# Patient Record
Sex: Female | Born: 1980
Health system: Southern US, Community
[De-identification: ages and names within clinical notes are randomized; demographics above are authoritative.]

## PROBLEM LIST (undated history)

## (undated) DIAGNOSIS — F329 Major depressive disorder, single episode, unspecified: Secondary | ICD-10-CM

## (undated) DIAGNOSIS — F32A Depression, unspecified: Secondary | ICD-10-CM

## (undated) DIAGNOSIS — N390 Urinary tract infection, site not specified: Secondary | ICD-10-CM

## (undated) HISTORY — DX: Depression, unspecified: F32.A

## (undated) HISTORY — DX: Urinary tract infection, site not specified: N39.0

## (undated) HISTORY — PX: ABLATION: SHX5711

---
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
## 2016-08-11 LAB — HM PAP SMEAR: HM Pap smear: NORMAL

## 2017-04-13 DIAGNOSIS — Z130 Encounter for screening for diseases of the blood and blood-forming organs and certain disorders involving the immune mechanism: Secondary | ICD-10-CM | POA: Diagnosis not present

## 2017-04-13 DIAGNOSIS — Z87891 Personal history of nicotine dependence: Secondary | ICD-10-CM | POA: Diagnosis not present

## 2017-04-13 DIAGNOSIS — K219 Gastro-esophageal reflux disease without esophagitis: Secondary | ICD-10-CM | POA: Diagnosis not present

## 2017-04-13 DIAGNOSIS — Z79899 Other long term (current) drug therapy: Secondary | ICD-10-CM | POA: Diagnosis not present

## 2017-04-13 DIAGNOSIS — Z13228 Encounter for screening for other metabolic disorders: Secondary | ICD-10-CM | POA: Diagnosis not present

## 2017-04-13 DIAGNOSIS — Z862 Personal history of diseases of the blood and blood-forming organs and certain disorders involving the immune mechanism: Secondary | ICD-10-CM | POA: Diagnosis not present

## 2017-04-13 DIAGNOSIS — Z1329 Encounter for screening for other suspected endocrine disorder: Secondary | ICD-10-CM | POA: Diagnosis not present

## 2017-04-13 DIAGNOSIS — R110 Nausea: Secondary | ICD-10-CM | POA: Diagnosis not present

## 2017-04-22 DIAGNOSIS — A048 Other specified bacterial intestinal infections: Secondary | ICD-10-CM | POA: Diagnosis not present

## 2017-04-22 DIAGNOSIS — N920 Excessive and frequent menstruation with regular cycle: Secondary | ICD-10-CM | POA: Diagnosis not present

## 2017-07-20 DIAGNOSIS — F341 Dysthymic disorder: Secondary | ICD-10-CM | POA: Diagnosis not present

## 2017-07-20 DIAGNOSIS — N920 Excessive and frequent menstruation with regular cycle: Secondary | ICD-10-CM | POA: Diagnosis not present

## 2017-08-03 DIAGNOSIS — Z01818 Encounter for other preprocedural examination: Secondary | ICD-10-CM | POA: Diagnosis not present

## 2017-08-06 DIAGNOSIS — N939 Abnormal uterine and vaginal bleeding, unspecified: Secondary | ICD-10-CM | POA: Diagnosis not present

## 2017-08-31 DIAGNOSIS — K219 Gastro-esophageal reflux disease without esophagitis: Secondary | ICD-10-CM | POA: Diagnosis not present

## 2017-08-31 DIAGNOSIS — A048 Other specified bacterial intestinal infections: Secondary | ICD-10-CM | POA: Diagnosis not present

## 2017-10-13 DIAGNOSIS — Z131 Encounter for screening for diabetes mellitus: Secondary | ICD-10-CM | POA: Diagnosis not present

## 2017-10-16 DIAGNOSIS — A048 Other specified bacterial intestinal infections: Secondary | ICD-10-CM | POA: Diagnosis not present

## 2017-11-09 DIAGNOSIS — Z1239 Encounter for other screening for malignant neoplasm of breast: Secondary | ICD-10-CM | POA: Diagnosis not present

## 2017-11-09 DIAGNOSIS — Z803 Family history of malignant neoplasm of breast: Secondary | ICD-10-CM | POA: Diagnosis not present

## 2017-11-09 DIAGNOSIS — Z9889 Other specified postprocedural states: Secondary | ICD-10-CM | POA: Diagnosis not present

## 2017-11-23 DIAGNOSIS — N3001 Acute cystitis with hematuria: Secondary | ICD-10-CM | POA: Diagnosis not present

## 2017-11-23 DIAGNOSIS — R300 Dysuria: Secondary | ICD-10-CM | POA: Diagnosis not present

## 2017-12-13 DIAGNOSIS — H1032 Unspecified acute conjunctivitis, left eye: Secondary | ICD-10-CM | POA: Diagnosis not present

## 2017-12-13 DIAGNOSIS — R21 Rash and other nonspecific skin eruption: Secondary | ICD-10-CM | POA: Diagnosis not present

## 2018-03-08 DIAGNOSIS — Z1239 Encounter for other screening for malignant neoplasm of breast: Secondary | ICD-10-CM | POA: Diagnosis not present

## 2018-03-08 DIAGNOSIS — Z803 Family history of malignant neoplasm of breast: Secondary | ICD-10-CM | POA: Diagnosis not present

## 2018-03-08 DIAGNOSIS — Z1231 Encounter for screening mammogram for malignant neoplasm of breast: Secondary | ICD-10-CM | POA: Diagnosis not present

## 2018-03-31 DIAGNOSIS — E669 Obesity, unspecified: Secondary | ICD-10-CM | POA: Diagnosis not present

## 2018-03-31 DIAGNOSIS — Z6836 Body mass index (BMI) 36.0-36.9, adult: Secondary | ICD-10-CM | POA: Diagnosis not present

## 2018-04-13 DIAGNOSIS — N390 Urinary tract infection, site not specified: Secondary | ICD-10-CM | POA: Diagnosis not present

## 2018-04-13 DIAGNOSIS — R300 Dysuria: Secondary | ICD-10-CM | POA: Diagnosis not present

## 2018-04-13 DIAGNOSIS — R319 Hematuria, unspecified: Secondary | ICD-10-CM | POA: Diagnosis not present

## 2018-09-08 DIAGNOSIS — N3091 Cystitis, unspecified with hematuria: Secondary | ICD-10-CM | POA: Diagnosis not present

## 2018-11-19 DIAGNOSIS — R300 Dysuria: Secondary | ICD-10-CM | POA: Diagnosis not present

## 2018-12-13 ENCOUNTER — Other Ambulatory Visit: Payer: Self-pay

## 2018-12-13 ENCOUNTER — Ambulatory Visit (INDEPENDENT_AMBULATORY_CARE_PROVIDER_SITE_OTHER): Payer: BC Managed Care – PPO | Admitting: Physician Assistant

## 2018-12-13 ENCOUNTER — Encounter: Payer: Self-pay | Admitting: Physician Assistant

## 2018-12-13 VITALS — BP 115/55 | HR 74 | Ht 64.0 in | Wt 215.0 lb

## 2018-12-13 DIAGNOSIS — R202 Paresthesia of skin: Secondary | ICD-10-CM

## 2018-12-13 DIAGNOSIS — R2231 Localized swelling, mass and lump, right upper limb: Secondary | ICD-10-CM

## 2018-12-13 DIAGNOSIS — N390 Urinary tract infection, site not specified: Secondary | ICD-10-CM | POA: Insufficient documentation

## 2018-12-13 DIAGNOSIS — F341 Dysthymic disorder: Secondary | ICD-10-CM | POA: Diagnosis not present

## 2018-12-13 DIAGNOSIS — E6609 Other obesity due to excess calories: Secondary | ICD-10-CM | POA: Insufficient documentation

## 2018-12-13 DIAGNOSIS — D1721 Benign lipomatous neoplasm of skin and subcutaneous tissue of right arm: Secondary | ICD-10-CM

## 2018-12-13 DIAGNOSIS — Z6836 Body mass index (BMI) 36.0-36.9, adult: Secondary | ICD-10-CM

## 2018-12-13 DIAGNOSIS — Z803 Family history of malignant neoplasm of breast: Secondary | ICD-10-CM

## 2018-12-13 DIAGNOSIS — R200 Anesthesia of skin: Secondary | ICD-10-CM

## 2018-12-13 NOTE — Patient Instructions (Signed)
[facility].  Exercise daily.  Follow up as needed.

## 2018-12-13 NOTE — Progress Notes (Signed)
Established Patient Office Visit  Subjective:  Patient ID: Jill Lloyd, female    DOB: December 25, 1980  Age: 38 y.o. MRN: [id number]  CC:  Chief Complaint  Patient presents with  . Establish Care    HPI Jill Lloyd presents to establish care. She has not had a PcP only a GYN and been going to UC for UTIs. She is having fairly frequent UTIs. Last one was beginning of November. She has had more and more since ablation. She wonders what next step is.   She has problems with dysthmia via counselor. Tried medications in the past and made things worse because she did not tolerate and had palpitations. Overall feels ok. She wishes she did have more motivation for things, especially exercise.   Pt has had numbness and tingling intermittently in her right hand and fingers for a long time. She is a hairstylist. Worse in thumb and index finger. Noticed about 2-3 months a firm mass of right anterior forearm. Non tender. No redness or swelling.    Past Medical History:  Diagnosis Date  . Depression   . Frequent UTI     Past Surgical History:  Procedure Laterality Date  . ABLATION    . CESAREAN SECTION      Family History  Problem Relation Age of Onset  . Breast cancer Mother   . Breast cancer Maternal Aunt   . Breast cancer Maternal Grandmother   . Colon cancer Other   . Skin cancer Other     Social History   Socioeconomic History  . Marital status: Married    Spouse name: Not on file  . Number of children: Not on file  . Years of education: Not on file  . Highest education level: Not on file  Occupational History  . Not on file  Social Needs  . Financial resource strain: Not on file  . Food insecurity    Worry: Not on file    Inability: Not on file  . Transportation needs    Medical: Not on file    Non-medical: Not on file  Tobacco Use  . Smoking status: Former Smoker    Quit date: 01/13/2009    Years since quitting: 9.9  . Smokeless tobacco: Never Used  Substance  and Sexual Activity  . Alcohol use: Yes    Comment: rarely  . Drug use: Never  . Sexual activity: Yes    Partners: Male    Birth control/protection: Surgical    Comment: husband - vasectomy  Lifestyle  . Physical activity    Days per week: Not on file    Minutes per session: Not on file  . Stress: Not on file  Relationships  . Social Herbalist on phone: Not on file    Gets together: Not on file    Attends religious service: Not on file    Active member of club or organization: Not on file    Attends meetings of clubs or organizations: Not on file    Relationship status: Not on file  . Intimate partner violence    Fear of current or ex partner: Not on file    Emotionally abused: Not on file    Physically abused: Not on file    Forced sexual activity: Not on file  Other Topics Concern  . Not on file  Social History Narrative  . Not on file    Outpatient Medications Prior to Visit  Medication Sig Dispense Refill  . Multiple  Vitamin (MULTI-VITAMIN PO) Take by mouth.    Marland Kitchen omeprazole (PRILOSEC) 20 MG capsule     . topiramate (TOPAMAX) 25 MG tablet Take by mouth.     No facility-administered medications prior to visit.     Allergies  Allergen Reactions  . Phentermine Hcl Shortness Of Breath    ROS Review of Systems See HPI.    Objective:    Physical Exam  Constitutional: She is oriented to person, place, and time. She appears well-developed and well-nourished.  HENT:  Head: Normocephalic and atraumatic.  Cardiovascular: Normal rate, regular rhythm and normal heart sounds.  Pulmonary/Chest: Effort normal and breath sounds normal.  Abdominal: She exhibits no distension. There is no abdominal tenderness.  Musculoskeletal:        General: No tenderness, deformity or edema.     Comments: Right forearm firm slight raised area about 2cm by 2cm. Non tender to palpation.  Tenderness to palpation over ulnar nerve in the olecranon and over medial epicondyle.   Negative phalen and tinels.  Normal 5/5 strength of upper extermity.   Neurological: She is alert and oriented to person, place, and time.  Psychiatric: She has a normal mood and affect. Her behavior is normal.    BP (!) 115/55   Pulse 74   Ht 5\' 4"  (1.626 m)   Wt 215 lb (97.5 kg)   SpO2 96%   BMI 36.90 kg/m  Wt Readings from Last 3 Encounters:  12/13/18 215 lb (97.5 kg)     .Marland Kitchen Depression screen PHQ 2/9 12/13/2018  Decreased Interest 0  Down, Depressed, Hopeless 0  PHQ - 2 Score 0  Altered sleeping 0  Tired, decreased energy 0  Change in appetite 0  Feeling bad or failure about yourself  0  Trouble concentrating 0  Moving slowly or fidgety/restless 0  Suicidal thoughts 0  PHQ-9 Score 0  Difficult doing work/chores Not difficult at all   .Marland Kitchen GAD 7 : Generalized Anxiety Score 12/13/2018  Nervous, Anxious, on Edge 0  Control/stop worrying 1  Worry too much - different things 0  Trouble relaxing 1  Restless 0  Easily annoyed or irritable 0  Afraid - awful might happen 0  Total GAD 7 Score 2  Anxiety Difficulty Not difficult at all     Assessment & Plan:   Problem List Items Addressed This Visit      Unprioritized   Class 2 obesity due to excess calories without serious comorbidity with body mass index (BMI) of 36.0 to 36.9 in adult   Frequent UTI - Primary   Arm mass, right   Dysthymia   Family history of breast cancer   Numbness and tingling in right hand     .Marland KitchenDiscussed low carb diet with 1500 calories and 80g of protein.  Exercising at least 150 minutes a week.  [employer] could be a Microbiologist.  Discussed IF 16:8. HO given.  Discussed medications. Follow up if would like to consider.   When get UTI follow up here. We can monitor and consider preventive treatment if necessary.   Discussed mood and natural therapy. She has not tolerated medications in the past. Consider genesight testing and/or [place]. Continue with counseling.   Reassurance  given on firm mass. Seems to be having some numbness and tingling. Suggested next step u/s ordered today of arm or see Dr. Darene Lamer concerning for evaluation of ulnar nerve entrapment. I do not think this is carpel tunnel.There is some irritation afround medial epicondyle  wear elbow brace to see if helps. NSAIDs as needed.   Follow-up: Return if symptoms worsen or fail to improve. 1-3 months.    Iran Planas, PA-C

## 2018-12-15 ENCOUNTER — Telehealth: Payer: Self-pay | Admitting: Physician Assistant

## 2018-12-15 ENCOUNTER — Encounter: Payer: Self-pay | Admitting: Physician Assistant

## 2018-12-15 DIAGNOSIS — R200 Anesthesia of skin: Secondary | ICD-10-CM | POA: Insufficient documentation

## 2018-12-15 DIAGNOSIS — Z803 Family history of malignant neoplasm of breast: Secondary | ICD-10-CM | POA: Insufficient documentation

## 2018-12-15 NOTE — Telephone Encounter (Signed)
Need Dr. Mariana Arn GYN labs and paps and mammograms

## 2018-12-15 NOTE — Telephone Encounter (Signed)
Request sent for records.

## 2018-12-20 ENCOUNTER — Ambulatory Visit (INDEPENDENT_AMBULATORY_CARE_PROVIDER_SITE_OTHER): Payer: BC Managed Care – PPO

## 2018-12-20 ENCOUNTER — Other Ambulatory Visit: Payer: Self-pay

## 2018-12-20 DIAGNOSIS — R2231 Localized swelling, mass and lump, right upper limb: Secondary | ICD-10-CM

## 2018-12-21 ENCOUNTER — Encounter: Payer: Self-pay | Admitting: Physician Assistant

## 2018-12-21 NOTE — Addendum Note (Signed)
Addended by: Donella Stade on: 12/21/2018 07:29 AM   Modules accepted: Orders

## 2018-12-21 NOTE — Progress Notes (Signed)
Call pt: there is a tail like configuration like a neurogenic tumor(those are usually benign) but they can intrap the nerve and cause the numbness and tingling that you reported. Need to get MRI to get a better look.

## 2018-12-22 MED ORDER — DIAZEPAM 5 MG PO TABS: ORAL_TABLET | ORAL | 0 refills | Status: DC

## 2018-12-22 NOTE — Addendum Note (Signed)
Addended by: Donella Stade on: 12/22/2018 11:51 AM   Modules accepted: Orders

## 2018-12-22 NOTE — Telephone Encounter (Signed)
Jill Lloyd, will you please call in medication for claustrophobia for pt's MRI?

## 2018-12-27 ENCOUNTER — Ambulatory Visit: Payer: BC Managed Care – PPO

## 2018-12-27 ENCOUNTER — Other Ambulatory Visit: Payer: Self-pay

## 2018-12-27 ENCOUNTER — Telehealth: Payer: Self-pay

## 2018-12-27 ENCOUNTER — Ambulatory Visit (INDEPENDENT_AMBULATORY_CARE_PROVIDER_SITE_OTHER): Payer: BC Managed Care – PPO

## 2018-12-27 DIAGNOSIS — R200 Anesthesia of skin: Secondary | ICD-10-CM

## 2018-12-27 DIAGNOSIS — R2231 Localized swelling, mass and lump, right upper limb: Secondary | ICD-10-CM | POA: Diagnosis not present

## 2018-12-27 DIAGNOSIS — R202 Paresthesia of skin: Secondary | ICD-10-CM

## 2018-12-27 MED ORDER — GADOBUTROL 1 MMOL/ML IV SOLN
9.5000 mL | Freq: Once | INTRAVENOUS | Status: AC | PRN
  Administered 2018-12-27: 9.5 mL via INTRAVENOUS

## 2018-12-27 MED ORDER — TRIAZOLAM 0.25 MG PO TABS: ORAL_TABLET | ORAL | 0 refills | Status: DC

## 2018-12-27 NOTE — Telephone Encounter (Signed)
Sent triazolam 1-2 tablets before procedure.

## 2018-12-27 NOTE — Telephone Encounter (Signed)
Ok to send take 2 tablets 30 minutes before procedure.

## 2018-12-27 NOTE — Telephone Encounter (Signed)
Pt aware.

## 2018-12-27 NOTE — Telephone Encounter (Signed)
Pt advised.

## 2018-12-27 NOTE — Telephone Encounter (Signed)
Imaging called, patient went for MRI today and pre-med was not enough for her. States she took one on the Valium and it did not help at all.   Patient is rescheduled for this afternoon to try MRI again, wants to know if Luvenia Starch will call something stronger in to the pharmacy

## 2018-12-27 NOTE — Telephone Encounter (Signed)
Patient said she need something stronger.Marland KitchenMarland KitchenMarland Kitchen

## 2018-12-28 ENCOUNTER — Encounter: Payer: Self-pay | Admitting: Physician Assistant

## 2018-12-28 DIAGNOSIS — D1721 Benign lipomatous neoplasm of skin and subcutaneous tissue of right arm: Secondary | ICD-10-CM | POA: Insufficient documentation

## 2018-12-28 NOTE — Addendum Note (Signed)
Addended by: Donella Stade on: 12/28/2018 02:32 PM   Modules accepted: Orders

## 2018-12-28 NOTE — Progress Notes (Signed)
Ok will order EMGs.

## 2018-12-28 NOTE — Progress Notes (Signed)
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 I think you should consider removal. Thoughts? I could place referral?

## 2018-12-30 ENCOUNTER — Encounter: Payer: Self-pay | Admitting: Physician Assistant

## 2018-12-31 NOTE — Telephone Encounter (Signed)
Thank you :)

## 2018-12-31 NOTE — Telephone Encounter (Signed)
Pt called and stated EMG with [facility] will be febuary. She wonders if there is anywhere else she can go?

## 2018-12-31 NOTE — Telephone Encounter (Signed)
Jill Lloyd    I am going to send her order to [facility] I think they can see her before Feb. I just had mine done last week with them. - CF

## 2019-01-03 ENCOUNTER — Encounter (INDEPENDENT_AMBULATORY_CARE_PROVIDER_SITE_OTHER): Payer: BC Managed Care – PPO | Admitting: Physician Assistant

## 2019-01-03 DIAGNOSIS — R300 Dysuria: Secondary | ICD-10-CM

## 2019-01-03 DIAGNOSIS — N390 Urinary tract infection, site not specified: Secondary | ICD-10-CM

## 2019-01-03 LAB — POCT URINALYSIS DIP (CLINITEK)
Bilirubin, UA: NEGATIVE
Glucose, UA: 100 mg/dL — AB
Ketones, POC UA: NEGATIVE mg/dL
Leukocytes, UA: NEGATIVE
Nitrite, UA: POSITIVE — AB
POC PROTEIN,UA: 30 — AB
Spec Grav, UA: 1.025 (ref 1.010–1.025)
Urobilinogen, UA: 2 E.U./dL — AB
pH, UA: 5 (ref 5.0–8.0)

## 2019-01-03 MED ORDER — NITROFURANTOIN MONOHYD MACRO 100 MG PO CAPS: 100.0000 mg | ORAL_CAPSULE | Freq: Two times a day (BID) | ORAL | 0 refills | Status: DC

## 2019-01-03 NOTE — Addendum Note (Signed)
Addended byAnnamaria Helling on: 01/03/2019 02:50 PM   Modules accepted: Orders

## 2019-01-03 NOTE — Telephone Encounter (Signed)
UA performed and culture sent.

## 2019-01-03 NOTE — Telephone Encounter (Signed)
I will send

## 2019-01-03 NOTE — Telephone Encounter (Signed)
Can let patient know positive. Start macrobid. When culture comes in will make sure sensitive to it. If not will send over something else.

## 2019-01-03 NOTE — Telephone Encounter (Signed)
Hx of recurrent UTI. Will come later to drop off urine for UA and culture.

## 2019-01-04 ENCOUNTER — Encounter: Payer: Self-pay | Admitting: Neurology

## 2019-01-05 ENCOUNTER — Encounter: Payer: Self-pay | Admitting: Physician Assistant

## 2019-01-05 LAB — URINE CULTURE
MICRO NUMBER:: 1218751
SPECIMEN QUALITY:: ADEQUATE

## 2019-01-05 NOTE — Telephone Encounter (Signed)
No definite bacteria detected. Appears like contamination.

## 2019-01-25 ENCOUNTER — Ambulatory Visit: Payer: BC Managed Care – PPO | Admitting: Neurology

## 2019-01-25 ENCOUNTER — Other Ambulatory Visit: Payer: Self-pay

## 2019-01-25 DIAGNOSIS — R202 Paresthesia of skin: Secondary | ICD-10-CM

## 2019-01-25 DIAGNOSIS — D1721 Benign lipomatous neoplasm of skin and subcutaneous tissue of right arm: Secondary | ICD-10-CM | POA: Diagnosis not present

## 2019-01-25 DIAGNOSIS — R200 Anesthesia of skin: Secondary | ICD-10-CM

## 2019-01-25 NOTE — Procedures (Signed)
[facility]  [address], [address]  [place], [place] [address] Tel: [phone] Fax:  [phone] Test Date:  01/25/2019  Patient: Shakevia Lopezperez DOB: [birth year] Physician: Narda Amber, DO  Sex: Female Height: 5\' 4"  Ref Phys: Iran Planas, PA-C  ID#: [id number] Temp: 34.0C Technician:    Patient Complaints: This is a 39 year old female referred for evaluation of right hand numbness and tingling.  NCV & EMG Findings: Extensive electrodiagnostic testing of the right upper extremity shows:  1. Right median, ulnar, and mixed palmar sensory responses are within normal limits. 2. Right median and ulnar motor responses are within normal limits. 3. There is no evidence of active or chronic motor axonal loss changes affecting any of the tested muscles.  Motor unit configuration and recruitment pattern is within normal limits.  Impression: This is a normal study of the right upper extremity. In particular, there is no evidence of carpal tunnel syndrome or cervical radiculopathy.   ___________________________ Narda Amber, DO    Nerve Conduction Studies Anti Sensory Summary Table   Site NR Peak (ms) Norm Peak (ms) P-T Amp (V) Norm P-T Amp  Right Median Anti Sensory (2nd Digit)  34C  Wrist    2.5 <3.4 68.6 >20  Right Ulnar Anti Sensory (5th Digit)  34C  Wrist    2.2 <3.1 48.0 >12   Motor Summary Table   Site NR Onset (ms) Norm Onset (ms) O-P Amp (mV) Norm O-P Amp Site1 Site2 Delta-0 (ms) Dist (cm) Vel (m/s) Norm Vel (m/s)  Right Median Motor (Abd Poll Brev)  34C  Wrist    2.2 <3.9 12.1 >6 Elbow Wrist 4.3 27.0 63 >50  Elbow    6.5  11.9         Right Ulnar Motor (Abd Dig Minimi)  34C  Wrist    2.0 <3.1 11.5 >7 B Elbow Wrist 3.2 22.0 69 >50  B Elbow    5.2  10.7  A Elbow B Elbow 1.5 10.0 67 >50  A Elbow    6.7  10.7          Comparison Summary Table   Site NR Peak (ms) Norm Peak (ms) P-T Amp (V) Site1 Site2 Delta-P (ms) Norm Delta (ms)  Right  Median/Ulnar Palm Comparison (Wrist - 8cm)  34C  Median Palm    1.5 <2.2 79.2 Median Palm Ulnar Palm 0.1   Ulnar Palm    1.6 <2.2 20.5       EMG   Side Muscle Ins Act Fibs Psw Fasc Number Recrt Dur Dur. Amp Amp. Poly Poly. Comment  Right 1stDorInt Nml Nml Nml Nml Nml Nml Nml Nml Nml Nml Nml Nml N/A  Right PronatorTeres Nml Nml Nml Nml Nml Nml Nml Nml Nml Nml Nml Nml N/A  Right Biceps Nml Nml Nml Nml Nml Nml Nml Nml Nml Nml Nml Nml N/A  Right Triceps Nml Nml Nml Nml Nml Nml Nml Nml Nml Nml Nml Nml N/A  Right Deltoid Nml Nml Nml Nml Nml Nml Nml Nml Nml Nml Nml Nml N/A      Waveforms:

## 2019-01-25 NOTE — Progress Notes (Signed)
Royston Sinner news. No evidence of carpel tunnel or cervical radiculopathy. I do think you need to get mass in right forearm removed. Are you ready for me to make referral?

## 2019-01-27 ENCOUNTER — Encounter: Payer: Self-pay | Admitting: Physician Assistant

## 2019-01-27 DIAGNOSIS — R2231 Localized swelling, mass and lump, right upper limb: Secondary | ICD-10-CM

## 2019-01-28 NOTE — Telephone Encounter (Signed)
Referral sent to Dr. Nicoletta Dress. Luvenia Starch - FYI

## 2019-02-05 DIAGNOSIS — R0981 Nasal congestion: Secondary | ICD-10-CM | POA: Diagnosis not present

## 2019-02-05 DIAGNOSIS — H579 Unspecified disorder of eye and adnexa: Secondary | ICD-10-CM | POA: Diagnosis not present

## 2019-02-05 DIAGNOSIS — Z20822 Contact with and (suspected) exposure to covid-19: Secondary | ICD-10-CM | POA: Diagnosis not present

## 2019-02-08 ENCOUNTER — Encounter: Payer: Self-pay | Admitting: Physician Assistant

## 2019-02-10 MED ORDER — OMEPRAZOLE 40 MG PO CPDR: 40.0000 mg | DELAYED_RELEASE_CAPSULE | Freq: Every day | ORAL | 1 refills | Status: DC

## 2019-02-14 DIAGNOSIS — R2231 Localized swelling, mass and lump, right upper limb: Secondary | ICD-10-CM | POA: Diagnosis not present

## 2019-03-03 ENCOUNTER — Telehealth (INDEPENDENT_AMBULATORY_CARE_PROVIDER_SITE_OTHER): Payer: BC Managed Care – PPO | Admitting: Family Medicine

## 2019-03-03 ENCOUNTER — Encounter: Payer: Self-pay | Admitting: Physician Assistant

## 2019-03-03 ENCOUNTER — Encounter: Payer: Self-pay | Admitting: Family Medicine

## 2019-03-03 DIAGNOSIS — Z87440 Personal history of urinary (tract) infections: Secondary | ICD-10-CM

## 2019-03-03 DIAGNOSIS — N3000 Acute cystitis without hematuria: Secondary | ICD-10-CM

## 2019-03-03 DIAGNOSIS — N390 Urinary tract infection, site not specified: Secondary | ICD-10-CM

## 2019-03-03 DIAGNOSIS — R300 Dysuria: Secondary | ICD-10-CM

## 2019-03-03 MED ORDER — SULFAMETHOXAZOLE-TRIMETHOPRIM 800-160 MG PO TABS: 1.0000 | ORAL_TABLET | Freq: Two times a day (BID) | ORAL | 0 refills | Status: DC

## 2019-03-03 NOTE — Progress Notes (Signed)
Virtual Visit via Video Note  I connected with Jill Lloyd on 03/03/19 at  1:20 PM EST by a video enabled telemedicine application and verified that I am speaking with the correct person using two identifiers.   I discussed the limitations of evaluation and management by telemedicine and the availability of in person appointments. The patient expressed understanding and agreed to proceed.  Subjective:    CC:    HPI:  39 yo with hx of UTIs for the last 1.5 years, starting after she had an endometrial ablation. She has moved into a new house that had a hottub.. Thought initially hottub was causing some of these.  Last UTI was at end of December.  Negative urine culture at that time.  Then had one about 6 weeks prior to that. Before that was averaging every 3 months. She is sexually active.  No blood in the urine.  + back pain. No nausea.   No fever, chills or sweats.    Past medical history, Surgical history, Family history not pertinant except as noted below, Social history, Allergies, and medications have been entered into the medical record, reviewed, and corrections made.   Review of Systems: No fevers, chills, night sweats, weight loss, chest pain, or shortness of breath.   Objective:    General: Speaking clearly in complete sentences without any shortness of breath.  Alert and oriented x3.  Normal judgment. No apparent acute distress.    Impression and Recommendations:    No problem-specific Assessment & Plan notes found for this encounter.   Acute cystitis - discussed will tx with symptoms.  She will try to give UA and culture in AM if building is open.  O/W will check just urine about 5 days after completed ABX to make sure cleared initial infection n  Recurrent UTI - discussed once clear up this current infection discussed starting prophylaxis with low dose abx.  It sounds like her UTIs have gone from Q3 mo to now every 6-8 weeks and hs been avoiding hottub. Doesn't seem to  be triggered by sex.     Time spent in encounter 25 minutes  I discussed the assessment and treatment plan with the patient. The patient was provided an opportunity to ask questions and all were answered. The patient agreed with the plan and demonstrated an understanding of the instructions.   The patient was advised to call back or seek an in-person evaluation if the symptoms worsen or if the condition fails to improve as anticipated.   Beatrice Lecher, MD

## 2019-03-04 DIAGNOSIS — N3000 Acute cystitis without hematuria: Secondary | ICD-10-CM | POA: Diagnosis not present

## 2019-03-04 DIAGNOSIS — R300 Dysuria: Secondary | ICD-10-CM | POA: Diagnosis not present

## 2019-03-05 LAB — URINALYSIS, ROUTINE W REFLEX MICROSCOPIC
Bacteria, UA: NONE SEEN /HPF
Bilirubin Urine: NEGATIVE
Glucose, UA: NEGATIVE
Hgb urine dipstick: NEGATIVE
Hyaline Cast: NONE SEEN /LPF
Nitrite: NEGATIVE
Protein, ur: NEGATIVE
RBC / HPF: NONE SEEN /HPF (ref 0–2)
Specific Gravity, Urine: 1.026 (ref 1.001–1.03)
pH: 6 (ref 5.0–8.0)

## 2019-03-05 LAB — URINE CULTURE
MICRO NUMBER:: 10168915
SPECIMEN QUALITY:: ADEQUATE

## 2019-03-10 DIAGNOSIS — Z8639 Personal history of other endocrine, nutritional and metabolic disease: Secondary | ICD-10-CM | POA: Diagnosis not present

## 2019-03-10 DIAGNOSIS — Z1239 Encounter for other screening for malignant neoplasm of breast: Secondary | ICD-10-CM | POA: Diagnosis not present

## 2019-03-10 DIAGNOSIS — N390 Urinary tract infection, site not specified: Secondary | ICD-10-CM | POA: Diagnosis not present

## 2019-03-10 DIAGNOSIS — Z01419 Encounter for gynecological examination (general) (routine) without abnormal findings: Secondary | ICD-10-CM | POA: Diagnosis not present

## 2019-03-17 ENCOUNTER — Encounter: Payer: Self-pay | Admitting: Physician Assistant

## 2019-03-17 DIAGNOSIS — N3020 Other chronic cystitis without hematuria: Secondary | ICD-10-CM

## 2019-03-17 DIAGNOSIS — Z13220 Encounter for screening for lipoid disorders: Secondary | ICD-10-CM

## 2019-03-17 DIAGNOSIS — Z6836 Body mass index (BMI) 36.0-36.9, adult: Secondary | ICD-10-CM

## 2019-03-17 DIAGNOSIS — Z1329 Encounter for screening for other suspected endocrine disorder: Secondary | ICD-10-CM

## 2019-03-17 DIAGNOSIS — E6609 Other obesity due to excess calories: Secondary | ICD-10-CM

## 2019-03-17 DIAGNOSIS — Z131 Encounter for screening for diabetes mellitus: Secondary | ICD-10-CM

## 2019-03-21 DIAGNOSIS — Z1329 Encounter for screening for other suspected endocrine disorder: Secondary | ICD-10-CM | POA: Diagnosis not present

## 2019-03-21 DIAGNOSIS — N3020 Other chronic cystitis without hematuria: Secondary | ICD-10-CM | POA: Insufficient documentation

## 2019-03-21 DIAGNOSIS — Z13220 Encounter for screening for lipoid disorders: Secondary | ICD-10-CM | POA: Diagnosis not present

## 2019-03-21 DIAGNOSIS — Z131 Encounter for screening for diabetes mellitus: Secondary | ICD-10-CM | POA: Diagnosis not present

## 2019-03-21 DIAGNOSIS — Z6836 Body mass index (BMI) 36.0-36.9, adult: Secondary | ICD-10-CM | POA: Diagnosis not present

## 2019-03-22 LAB — COMPLETE METABOLIC PANEL WITH GFR
AG Ratio: 1.6 (calc) (ref 1.0–2.5)
ALT: 15 U/L (ref 6–29)
AST: 12 U/L (ref 10–30)
Albumin: 4.4 g/dL (ref 3.6–5.1)
Alkaline phosphatase (APISO): 62 U/L (ref 31–125)
BUN: 14 mg/dL (ref 7–25)
CO2: 27 mmol/L (ref 20–32)
Calcium: 9.7 mg/dL (ref 8.6–10.2)
Chloride: 106 mmol/L (ref 98–110)
Creat: 0.6 mg/dL (ref 0.50–1.10)
GFR, Est African American: 134 mL/min/{1.73_m2} (ref >=60)
GFR, Est Non African American: 116 mL/min/{1.73_m2} (ref >=60)
Globulin: 2.8 g/dL (calc) (ref 1.9–3.7)
Glucose, Bld: 119 mg/dL (ref 65–139)
Potassium: 4.4 mmol/L (ref 3.5–5.3)
Sodium: 140 mmol/L (ref 135–146)
Total Bilirubin: 0.5 mg/dL (ref 0.2–1.2)
Total Protein: 7.2 g/dL (ref 6.1–8.1)

## 2019-03-22 LAB — HEMOGLOBIN A1C
Hgb A1c MFr Bld: 5.5 % of total Hgb (ref <5.7)
Mean Plasma Glucose: 111 (calc)
eAG (mmol/L): 6.2 (calc)

## 2019-03-22 LAB — LIPID PANEL W/REFLEX DIRECT LDL
Cholesterol: 159 mg/dL (ref <200)
HDL: 46 mg/dL — ABNORMAL LOW (ref >=50)
LDL Cholesterol (Calc): 98 mg/dL (calc)
Non-HDL Cholesterol (Calc): 113 mg/dL (calc) (ref <130)
Total CHOL/HDL Ratio: 3.5 (calc) (ref <5.0)
Triglycerides: 63 mg/dL (ref <150)

## 2019-03-22 LAB — TSH: TSH: 1.06 mIU/L

## 2019-03-22 NOTE — Telephone Encounter (Signed)
[place],   Cholesterol looks good. Increasing exercise can help get HDL(good cholesterol) above 50.  Kidney, liver, glucose look good.  A1C normal range.  Thyroid is perfect!  -Luvenia Starch

## 2019-03-24 DIAGNOSIS — R509 Fever, unspecified: Secondary | ICD-10-CM | POA: Diagnosis not present

## 2019-03-24 DIAGNOSIS — Z20822 Contact with and (suspected) exposure to covid-19: Secondary | ICD-10-CM | POA: Diagnosis not present

## 2019-03-28 DIAGNOSIS — Z1231 Encounter for screening mammogram for malignant neoplasm of breast: Secondary | ICD-10-CM | POA: Diagnosis not present

## 2019-03-28 DIAGNOSIS — Z1239 Encounter for other screening for malignant neoplasm of breast: Secondary | ICD-10-CM | POA: Diagnosis not present

## 2019-03-29 ENCOUNTER — Encounter: Payer: Self-pay | Admitting: Physician Assistant

## 2019-04-21 DIAGNOSIS — Z87440 Personal history of urinary (tract) infections: Secondary | ICD-10-CM | POA: Diagnosis not present

## 2019-05-17 ENCOUNTER — Encounter: Payer: Self-pay | Admitting: Physician Assistant

## 2019-08-02 ENCOUNTER — Other Ambulatory Visit: Payer: Self-pay | Admitting: Physician Assistant

## 2019-10-24 DIAGNOSIS — Z87440 Personal history of urinary (tract) infections: Secondary | ICD-10-CM | POA: Diagnosis not present

## 2019-10-24 DIAGNOSIS — R300 Dysuria: Secondary | ICD-10-CM | POA: Diagnosis not present

## 2019-10-30 ENCOUNTER — Other Ambulatory Visit: Payer: Self-pay | Admitting: Physician Assistant

## 2019-11-29 ENCOUNTER — Other Ambulatory Visit: Payer: Self-pay | Admitting: Physician Assistant

## 2019-12-16 ENCOUNTER — Other Ambulatory Visit: Payer: Self-pay | Admitting: Physician Assistant

## 2020-02-06 DIAGNOSIS — M25511 Pain in right shoulder: Secondary | ICD-10-CM | POA: Diagnosis not present

## 2020-02-06 DIAGNOSIS — M6281 Muscle weakness (generalized): Secondary | ICD-10-CM | POA: Diagnosis not present

## 2020-02-13 DIAGNOSIS — M25511 Pain in right shoulder: Secondary | ICD-10-CM | POA: Diagnosis not present

## 2020-02-13 DIAGNOSIS — M6281 Muscle weakness (generalized): Secondary | ICD-10-CM | POA: Diagnosis not present

## 2020-02-16 DIAGNOSIS — M6281 Muscle weakness (generalized): Secondary | ICD-10-CM | POA: Diagnosis not present

## 2020-02-16 DIAGNOSIS — M25511 Pain in right shoulder: Secondary | ICD-10-CM | POA: Diagnosis not present

## 2020-02-20 DIAGNOSIS — M25511 Pain in right shoulder: Secondary | ICD-10-CM | POA: Diagnosis not present

## 2020-02-20 DIAGNOSIS — M6281 Muscle weakness (generalized): Secondary | ICD-10-CM | POA: Diagnosis not present

## 2020-02-23 DIAGNOSIS — M6281 Muscle weakness (generalized): Secondary | ICD-10-CM | POA: Diagnosis not present

## 2020-02-23 DIAGNOSIS — M25511 Pain in right shoulder: Secondary | ICD-10-CM | POA: Diagnosis not present

## 2020-02-27 ENCOUNTER — Encounter: Payer: Self-pay | Admitting: Physician Assistant

## 2020-02-27 DIAGNOSIS — M25511 Pain in right shoulder: Secondary | ICD-10-CM | POA: Diagnosis not present

## 2020-02-27 DIAGNOSIS — M6281 Muscle weakness (generalized): Secondary | ICD-10-CM | POA: Diagnosis not present

## 2020-03-05 DIAGNOSIS — M25511 Pain in right shoulder: Secondary | ICD-10-CM | POA: Diagnosis not present

## 2020-03-05 DIAGNOSIS — M6281 Muscle weakness (generalized): Secondary | ICD-10-CM | POA: Diagnosis not present

## 2020-03-08 DIAGNOSIS — M25511 Pain in right shoulder: Secondary | ICD-10-CM | POA: Diagnosis not present

## 2020-03-08 DIAGNOSIS — M6281 Muscle weakness (generalized): Secondary | ICD-10-CM | POA: Diagnosis not present

## 2020-03-12 DIAGNOSIS — M25511 Pain in right shoulder: Secondary | ICD-10-CM | POA: Diagnosis not present

## 2020-03-12 DIAGNOSIS — M6281 Muscle weakness (generalized): Secondary | ICD-10-CM | POA: Diagnosis not present

## 2020-03-19 ENCOUNTER — Other Ambulatory Visit: Payer: Self-pay

## 2020-03-19 ENCOUNTER — Ambulatory Visit: Payer: BC Managed Care – PPO | Admitting: Physician Assistant

## 2020-03-19 ENCOUNTER — Encounter: Payer: Self-pay | Admitting: Physician Assistant

## 2020-03-19 VITALS — BP 117/54 | HR 64 | Ht 64.0 in | Wt 220.0 lb

## 2020-03-19 DIAGNOSIS — Z1329 Encounter for screening for other suspected endocrine disorder: Secondary | ICD-10-CM

## 2020-03-19 DIAGNOSIS — K219 Gastro-esophageal reflux disease without esophagitis: Secondary | ICD-10-CM

## 2020-03-19 DIAGNOSIS — Z5320 Procedure and treatment not carried out because of patient's decision for unspecified reasons: Secondary | ICD-10-CM

## 2020-03-19 DIAGNOSIS — Z1159 Encounter for screening for other viral diseases: Secondary | ICD-10-CM

## 2020-03-19 DIAGNOSIS — Z131 Encounter for screening for diabetes mellitus: Secondary | ICD-10-CM | POA: Diagnosis not present

## 2020-03-19 DIAGNOSIS — Z0000 Encounter for general adult medical examination without abnormal findings: Secondary | ICD-10-CM

## 2020-03-19 DIAGNOSIS — E6609 Other obesity due to excess calories: Secondary | ICD-10-CM

## 2020-03-19 DIAGNOSIS — Z13220 Encounter for screening for lipoid disorders: Secondary | ICD-10-CM | POA: Diagnosis not present

## 2020-03-19 DIAGNOSIS — Z6837 Body mass index (BMI) 37.0-37.9, adult: Secondary | ICD-10-CM

## 2020-03-19 MED ORDER — OMEPRAZOLE 40 MG PO CPDR: DELAYED_RELEASE_CAPSULE | ORAL | 3 refills | Status: DC

## 2020-03-19 NOTE — Patient Instructions (Signed)
Health Maintenance, Female Adopting a healthy lifestyle and getting preventive care are important in promoting health and wellness. Ask your health care provider about:  The right schedule for you to have regular tests and exams.  Things you can do on your own to prevent diseases and keep yourself healthy. What should I know about diet, weight, and exercise? Eat a healthy diet  Eat a diet that includes plenty of vegetables, fruits, low-fat dairy products, and lean protein.  Do not eat a lot of foods that are high in solid fats, added sugars, or sodium.   Maintain a healthy weight Body mass index (BMI) is used to identify weight problems. It estimates body fat based on height and weight. Your health care provider can help determine your BMI and help you achieve or maintain a healthy weight. Get regular exercise Get regular exercise. This is one of the most important things you can do for your health. Most adults should:  Exercise for at least 150 minutes each week. The exercise should increase your heart rate and make you sweat (moderate-intensity exercise).  Do strengthening exercises at least twice a week. This is in addition to the moderate-intensity exercise.  Spend less time sitting. Even light physical activity can be beneficial. Watch cholesterol and blood lipids Have your blood tested for lipids and cholesterol at 40 years of age, then have this test every 5 years. Have your cholesterol levels checked more often if:  Your lipid or cholesterol levels are high.  You are older than 40 years of age.  You are at high risk for heart disease. What should I know about cancer screening? Depending on your health history and family history, you may need to have cancer screening at various ages. This may include screening for:  Breast cancer.  Cervical cancer.  Colorectal cancer.  Skin cancer.  Lung cancer. What should I know about heart disease, diabetes, and high blood  pressure? Blood pressure and heart disease  High blood pressure causes heart disease and increases the risk of stroke. This is more likely to develop in people who have high blood pressure readings, are of African descent, or are overweight.  Have your blood pressure checked: ? Every 3-5 years if you are 18-39 years of age. ? Every year if you are 40 years old or older. Diabetes Have regular diabetes screenings. This checks your fasting blood sugar level. Have the screening done:  Once every three years after age 40 if you are at a normal weight and have a low risk for diabetes.  More often and at a younger age if you are overweight or have a high risk for diabetes. What should I know about preventing infection? Hepatitis B If you have a higher risk for hepatitis B, you should be screened for this virus. Talk with your health care provider to find out if you are at risk for hepatitis B infection. Hepatitis C Testing is recommended for:  Everyone born from 1945 through 1965.  Anyone with known risk factors for hepatitis C. Sexually transmitted infections (STIs)  Get screened for STIs, including gonorrhea and chlamydia, if: ? You are sexually active and are younger than 40 years of age. ? You are older than 40 years of age and your health care provider tells you that you are at risk for this type of infection. ? Your sexual activity has changed since you were last screened, and you are at increased risk for chlamydia or gonorrhea. Ask your health care provider   if you are at risk.  Ask your health care provider about whether you are at high risk for HIV. Your health care provider may recommend a prescription medicine to help prevent HIV infection. If you choose to take medicine to prevent HIV, you should first get tested for HIV. You should then be tested every 3 months for as long as you are taking the medicine. Pregnancy  If you are about to stop having your period (premenopausal) and  you may become pregnant, seek counseling before you get pregnant.  Take 400 to 800 micrograms (mcg) of folic acid every day if you become pregnant.  Ask for birth control (contraception) if you want to prevent pregnancy. Osteoporosis and menopause Osteoporosis is a disease in which the bones lose minerals and strength with aging. This can result in bone fractures. If you are 65 years old or older, or if you are at risk for osteoporosis and fractures, ask your health care provider if you should:  Be screened for bone loss.  Take a calcium or vitamin D supplement to lower your risk of fractures.  Be given hormone replacement therapy (HRT) to treat symptoms of menopause. Follow these instructions at home: Lifestyle  Do not use any products that contain nicotine or tobacco, such as cigarettes, e-cigarettes, and chewing tobacco. If you need help quitting, ask your health care provider.  Do not use street drugs.  Do not share needles.  Ask your health care provider for help if you need support or information about quitting drugs. Alcohol use  Do not drink alcohol if: ? Your health care provider tells you not to drink. ? You are pregnant, may be pregnant, or are planning to become pregnant.  If you drink alcohol: ? Limit how much you use to 0-1 drink a day. ? Limit intake if you are breastfeeding.  Be aware of how much alcohol is in your drink. In the [place] one drink equals one 12 oz bottle of beer (355 mL), one 5 oz glass of wine (148 mL), or one 1 oz glass of hard liquor (44 mL). General instructions  Schedule regular health, dental, and eye exams.  Stay current with your vaccines.  Tell your health care provider if: ? You often feel depressed. ? You have ever been abused or do not feel safe at home. Summary  Adopting a healthy lifestyle and getting preventive care are important in promoting health and wellness.  Follow your health care provider's instructions about healthy  diet, exercising, and getting tested or screened for diseases.  Follow your health care provider's instructions on monitoring your cholesterol and blood pressure. This information is not intended to replace advice given to you by your health care provider. Make sure you discuss any questions you have with your health care provider. Document Revised: 12/23/2017 Document Reviewed: 12/23/2017 Elsevier Patient Education  2021 [employer]  

## 2020-03-19 NOTE — Progress Notes (Signed)
Subjective:     Jill Lloyd is a 40 y.o. female and is here for a comprehensive physical exam. The patient reports no problems.  Social History   Socioeconomic History  . Marital status: Married    Spouse name: Not on file  . Number of children: Not on file  . Years of education: Not on file  . Highest education level: Not on file  Occupational History  . Not on file  Tobacco Use  . Smoking status: Former Smoker    Quit date: 01/13/2009    Years since quitting: 11.1  . Smokeless tobacco: Never Used  Substance and Sexual Activity  . Alcohol use: Yes    Comment: rarely  . Drug use: Never  . Sexual activity: Yes    Partners: Male    Birth control/protection: Surgical    Comment: husband - vasectomy  Other Topics Concern  . Not on file  Social History Narrative  . Not on file   Social Determinants of Health   Financial Resource Strain: Not on file  Food Insecurity: Not on file  Transportation Needs: Not on file  Physical Activity: Not on file  Stress: Not on file  Social Connections: Not on file  Intimate Partner Violence: Not on file   Health Maintenance  Topic Date Due  . Hepatitis C Screening  Never done  . HIV Screening  Never done  . PAP SMEAR-Modifier  03/19/2020 (Originally 12/13/2019)  . COVID-19 Vaccine (1) 04/04/2020 (Originally [birth year]/1985)  . INFLUENZA VACCINE  04/12/2020 (Originally 08/14/2019)  . MAMMOGRAM  03/19/2021 (Originally 10/13/2019)  . TETANUS/TDAP  08/17/2021  . HPV VACCINES  Aged Out    The following portions of the patient's history were reviewed and updated as appropriate: allergies, current medications, past family history, past medical history, past social history, past surgical history and problem list.  Review of Systems A comprehensive review of systems was negative.   Objective:    BP (!) 117/54   Pulse 64   Ht 5\' 4"  (1.626 m)   Wt 220 lb (99.8 kg)   SpO2 97%   BMI 37.76 kg/m  General appearance: alert, cooperative, appears  stated age and mildly obese Head: Normocephalic, without obvious abnormality, atraumatic Eyes: conjunctivae/corneas clear. PERRL, EOM's intact. Fundi benign. Ears: normal TM's and external ear canals both ears Nose: Nares normal. Septum midline. Mucosa normal. No drainage or sinus tenderness. Throat: lips, mucosa, and tongue normal; teeth and gums normal Neck: no adenopathy, no carotid bruit, no JVD, supple, symmetrical, trachea midline and thyroid not enlarged, symmetric, no tenderness/mass/nodules Back: symmetric, no curvature. ROM normal. No CVA tenderness. Lungs: clear to auscultation bilaterally Heart: regular rate and rhythm, S1, S2 normal, no murmur, click, rub or gallop Abdomen: soft, non-tender; bowel sounds normal; no masses,  no organomegaly Extremities: extremities normal, atraumatic, no cyanosis or edema Pulses: 2+ and symmetric Skin: Skin color, texture, turgor normal. No rashes or lesions Lymph nodes: Cervical, supraclavicular, and axillary nodes normal. Neurologic: Alert and oriented X 3, normal strength and tone. Normal symmetric reflexes. Normal coordination and gait   .Marland Kitchen Depression screen [facility] 2/9 03/19/2020 12/13/2018  Decreased Interest 0 0  Down, Depressed, Hopeless 0 0  PHQ - 2 Score 0 0  Altered sleeping 0 0  Tired, decreased energy 0 0  Change in appetite 0 0  Feeling bad or failure about yourself  0 0  Trouble concentrating 0 0  Moving slowly or fidgety/restless 0 0  Suicidal thoughts 0 0  PHQ-9 Score 0  0  Difficult doing work/chores Not difficult at all Not difficult at all   .Marland Kitchen GAD 7 : Generalized Anxiety Score 03/19/2020 12/13/2018  Nervous, Anxious, on Edge 0 0  Control/stop worrying 0 1  Worry too much - different things 0 0  Trouble relaxing 0 1  Restless 0 0  Easily annoyed or irritable 0 0  Afraid - awful might happen 0 0  Total GAD 7 Score 0 2  Anxiety Difficulty Not difficult at all Not difficult at all     Assessment:    Healthy female  exam.      Plan:  Marland KitchenMarland KitchenLauren was seen today for annual exam.  Diagnoses and all orders for this visit:  Routine physical examination -     CBC with Differential/Platelet -     COMPLETE METABOLIC PANEL WITH GFR -     Lipid Panel w/reflex Direct LDL -     TSH  Screening for diabetes mellitus -     COMPLETE METABOLIC PANEL WITH GFR  Screening for lipid disorders -     Lipid Panel w/reflex Direct LDL  Screening for thyroid disorder -     TSH  Class 2 obesity due to excess calories without serious comorbidity with body mass index (BMI) of 37.0 to 37.9 in adult  Encounter for hepatitis C screening test for low risk patient -     Hepatitis C Antibody  HIV screening declined -     HIV antibody (with reflex)  Gastroesophageal reflux disease, unspecified whether esophagitis present -     omeprazole (PRILOSEC) 40 MG capsule; TAKE 1 CAPSULE BY MOUTH DAILY   .. Discussed 150 minutes of exercise a week.  Encouraged vitamin D 1000 units and Calcium 1300mg  or 4 servings of dairy a day.  Fasting labs ordered.  Hep C and HIV ordered.  Mammogram not indicated.  Pap due. She sees GYN.  Declined covid/flu.  GERD-controlled. Refilled omeprazole.   Marland Kitchen.Discussed low carb diet with 1500 calories and 80g of protein.  Exercising at least 150 minutes a week.  [employer] could be a Microbiologist.  Discussed weight loss medications and optivia.    See After Visit Summary for Counseling Recommendations

## 2020-03-20 ENCOUNTER — Encounter: Payer: Self-pay | Admitting: Physician Assistant

## 2020-03-20 DIAGNOSIS — Z13220 Encounter for screening for lipoid disorders: Secondary | ICD-10-CM

## 2020-03-20 DIAGNOSIS — R7309 Other abnormal glucose: Secondary | ICD-10-CM

## 2020-03-20 LAB — COMPLETE METABOLIC PANEL WITH GFR
AG Ratio: 1.6 (calc) (ref 1.0–2.5)
ALT: 40 U/L — ABNORMAL HIGH (ref 6–29)
AST: 25 U/L (ref 10–30)
Albumin: 4.5 g/dL (ref 3.6–5.1)
Alkaline phosphatase (APISO): 79 U/L (ref 31–125)
BUN: 10 mg/dL (ref 7–25)
CO2: 28 mmol/L (ref 20–32)
Calcium: 9.6 mg/dL (ref 8.6–10.2)
Chloride: 105 mmol/L (ref 98–110)
Creat: 0.67 mg/dL (ref 0.50–1.10)
GFR, Est African American: 128 mL/min/{1.73_m2} (ref >=60)
GFR, Est Non African American: 111 mL/min/{1.73_m2} (ref >=60)
Globulin: 2.8 g/dL (calc) (ref 1.9–3.7)
Glucose, Bld: 143 mg/dL — ABNORMAL HIGH (ref 65–99)
Potassium: 4.1 mmol/L (ref 3.5–5.3)
Sodium: 140 mmol/L (ref 135–146)
Total Bilirubin: 0.6 mg/dL (ref 0.2–1.2)
Total Protein: 7.3 g/dL (ref 6.1–8.1)

## 2020-03-20 LAB — LIPID PANEL W/REFLEX DIRECT LDL
Cholesterol: 205 mg/dL — ABNORMAL HIGH (ref <200)
HDL: 55 mg/dL (ref >=50)
LDL Cholesterol (Calc): 125 mg/dL (calc) — ABNORMAL HIGH
Non-HDL Cholesterol (Calc): 150 mg/dL (calc) — ABNORMAL HIGH (ref <130)
Total CHOL/HDL Ratio: 3.7 (calc) (ref <5.0)
Triglycerides: 130 mg/dL (ref <150)

## 2020-03-20 LAB — CBC WITH DIFFERENTIAL/PLATELET
Absolute Monocytes: 482 cells/uL (ref 200–950)
Basophils Absolute: 32 cells/uL (ref 0–200)
Basophils Relative: 0.4 %
Eosinophils Absolute: 142 cells/uL (ref 15–500)
Eosinophils Relative: 1.8 %
HCT: 40.6 % (ref 35.0–45.0)
Hemoglobin: 13.1 g/dL (ref 11.7–15.5)
Lymphs Abs: 2346 cells/uL (ref 850–3900)
MCH: 28.1 pg (ref 27.0–33.0)
MCHC: 32.3 g/dL (ref 32.0–36.0)
MCV: 87.1 fL (ref 80.0–100.0)
MPV: 10.9 fL (ref 7.5–12.5)
Monocytes Relative: 6.1 %
Neutro Abs: 4898 cells/uL (ref 1500–7800)
Neutrophils Relative %: 62 %
Platelets: 303 10*3/uL (ref 140–400)
RBC: 4.66 10*6/uL (ref 3.80–5.10)
RDW: 12.3 % (ref 11.0–15.0)
Total Lymphocyte: 29.7 %
WBC: 7.9 10*3/uL (ref 3.8–10.8)

## 2020-03-20 LAB — HIV ANTIBODY (ROUTINE TESTING W REFLEX): HIV 1&2 Ab, 4th Generation: NONREACTIVE

## 2020-03-20 LAB — TSH: TSH: 1.16 mIU/L

## 2020-03-20 NOTE — Progress Notes (Signed)
Nguyen,   Were you fasting? Your glucose was pretty high. We need to add A!C to evaluate for diabetes.  Thyroid great.  Normal hemoglobin.  Kidney, liver look great.  HDL, good cholesterol, great.  LDL, bad cholesterol, not quite to optimal level.

## 2020-03-20 NOTE — Telephone Encounter (Signed)
Did not add A1C as the glucose wasn't fasting. Pended orders. Did you want to repeat cholesterol as well? Not sure that insurance would cover so soon.

## 2020-03-22 DIAGNOSIS — R7309 Other abnormal glucose: Secondary | ICD-10-CM | POA: Diagnosis not present

## 2020-03-23 LAB — GLUCOSE, RANDOM: Glucose, Plasma: 95 mg/dL (ref 65–139)

## 2020-03-23 NOTE — Telephone Encounter (Signed)
Fasting glucose in normal range.

## 2020-05-07 DIAGNOSIS — Z01419 Encounter for gynecological examination (general) (routine) without abnormal findings: Secondary | ICD-10-CM | POA: Diagnosis not present

## 2020-05-07 DIAGNOSIS — Z1239 Encounter for other screening for malignant neoplasm of breast: Secondary | ICD-10-CM | POA: Diagnosis not present

## 2020-05-07 DIAGNOSIS — Z803 Family history of malignant neoplasm of breast: Secondary | ICD-10-CM | POA: Diagnosis not present

## 2020-05-07 DIAGNOSIS — N898 Other specified noninflammatory disorders of vagina: Secondary | ICD-10-CM | POA: Diagnosis not present

## 2020-06-02 DIAGNOSIS — J019 Acute sinusitis, unspecified: Secondary | ICD-10-CM | POA: Diagnosis not present

## 2020-06-02 DIAGNOSIS — Z20822 Contact with and (suspected) exposure to covid-19: Secondary | ICD-10-CM | POA: Diagnosis not present

## 2020-06-02 DIAGNOSIS — Z8616 Personal history of COVID-19: Secondary | ICD-10-CM | POA: Diagnosis not present

## 2020-06-02 DIAGNOSIS — H9201 Otalgia, right ear: Secondary | ICD-10-CM | POA: Diagnosis not present

## 2020-06-05 DIAGNOSIS — Z1231 Encounter for screening mammogram for malignant neoplasm of breast: Secondary | ICD-10-CM | POA: Diagnosis not present

## 2020-06-05 DIAGNOSIS — Z1239 Encounter for other screening for malignant neoplasm of breast: Secondary | ICD-10-CM | POA: Diagnosis not present

## 2020-06-05 DIAGNOSIS — Z803 Family history of malignant neoplasm of breast: Secondary | ICD-10-CM | POA: Diagnosis not present

## 2020-06-05 LAB — HM MAMMOGRAPHY

## 2020-06-19 ENCOUNTER — Telehealth (INDEPENDENT_AMBULATORY_CARE_PROVIDER_SITE_OTHER): Payer: BC Managed Care – PPO | Admitting: Physician Assistant

## 2020-06-19 ENCOUNTER — Encounter: Payer: Self-pay | Admitting: Physician Assistant

## 2020-06-19 DIAGNOSIS — R310 Gross hematuria: Secondary | ICD-10-CM | POA: Insufficient documentation

## 2020-06-19 DIAGNOSIS — N3001 Acute cystitis with hematuria: Secondary | ICD-10-CM | POA: Insufficient documentation

## 2020-06-19 MED ORDER — NITROFURANTOIN MONOHYD MACRO 100 MG PO CAPS: 100.0000 mg | ORAL_CAPSULE | Freq: Two times a day (BID) | ORAL | 0 refills | Status: DC

## 2020-06-19 NOTE — Progress Notes (Signed)
Patient ID: Jill Lloyd, female   DOB: [birth year], 40 y.o.   MRN: [id number]  .Marland KitchenVirtual Visit via Telephone Note  I connected with Jill Lloyd on 06/19/20 at  2:40 PM EDT by telephone and verified that I am speaking with the correct person using two identifiers.  Location: Patient: work Provider: clinic  .Marland KitchenParticipating in visit:  Patient: Jill Lloyd Provider: Iran Planas PA-C   I discussed the limitations, risks, security and privacy concerns of performing an evaluation and management service by telephone and the availability of in person appointments. I also discussed with the patient that there may be a patient responsible charge related to this service. The patient expressed understanding and agreed to proceed.   History of Present Illness: Pt is a 40 yo female with hx of chronic cystitis and UTIs who has had severe urinary symptoms since this morning that have not responded to her pyridium. She was given pyridium for her chronic cystitis symptoms that were not related to bacterial infection. She has not had bacterial UTI in years. Her symptoms this morning are not like her normal symptoms she is actively peeing blood. She is having lots of dysuria and frequent urination. Denies any fever, chills, n/v/d, abdominal pain or flank pain.     .. Active Ambulatory Problems    Diagnosis Date Noted  . Class 2 obesity due to excess calories without serious comorbidity with body mass index (BMI) of 37.0 to 37.9 in adult 12/13/2018  . Frequent UTI 12/13/2018  . Arm mass, right 12/13/2018  . Dysthymia 12/13/2018  . Family history of breast cancer 12/15/2018  . Numbness and tingling in right hand 12/15/2018  . Lipoma of right forearm 12/28/2018  . Chronic cystitis 03/21/2019  . Gastroesophageal reflux disease 03/19/2020  . Gross hematuria 06/19/2020  . Acute cystitis with hematuria 06/19/2020   Resolved Ambulatory Problems    Diagnosis Date Noted  . No Resolved Ambulatory Problems    Past Medical History:  Diagnosis Date  . Depression     Observations/Objective: No acute distress    Assessment and Plan: Marland KitchenMarland KitchenLauren was seen today for urinary tract infection.  Diagnoses and all orders for this visit:  Acute cystitis with hematuria -     nitrofurantoin, macrocrystal-monohydrate, (MACROBID) 100 MG capsule; Take 1 capsule (100 mg total) by mouth 2 (two) times daily. For 5 days.  Gross hematuria -     nitrofurantoin, macrocrystal-monohydrate, (MACROBID) 100 MG capsule; Take 1 capsule (100 mg total) by mouth 2 (two) times daily. For 5 days.   Ok to use pyridium for next day or so.  Will treat empirically for acute UTI. Start macrobid for 5 days.  If not improving needs to come back for culture.  Symptomatic care discussed with hydration, tylenol, ibuprofen.  Follow up as needed.    Follow Up Instructions:    I discussed the assessment and treatment plan with the patient. The patient was provided an opportunity to ask questions and all were answered. The patient agreed with the plan and demonstrated an understanding of the instructions.   The patient was advised to call back or seek an in-person evaluation if the symptoms worsen or if the condition fails to improve as anticipated.  I provided 10 minutes of non-face-to-face time during this encounter.   Iran Planas, PA-C

## 2020-11-05 DIAGNOSIS — M9903 Segmental and somatic dysfunction of lumbar region: Secondary | ICD-10-CM | POA: Diagnosis not present

## 2020-11-05 DIAGNOSIS — M9901 Segmental and somatic dysfunction of cervical region: Secondary | ICD-10-CM | POA: Diagnosis not present

## 2020-11-05 DIAGNOSIS — M9905 Segmental and somatic dysfunction of pelvic region: Secondary | ICD-10-CM | POA: Diagnosis not present

## 2020-11-05 DIAGNOSIS — M545 Low back pain, unspecified: Secondary | ICD-10-CM | POA: Diagnosis not present

## 2020-11-12 DIAGNOSIS — M9905 Segmental and somatic dysfunction of pelvic region: Secondary | ICD-10-CM | POA: Diagnosis not present

## 2020-11-12 DIAGNOSIS — M9901 Segmental and somatic dysfunction of cervical region: Secondary | ICD-10-CM | POA: Diagnosis not present

## 2020-11-12 DIAGNOSIS — M545 Low back pain, unspecified: Secondary | ICD-10-CM | POA: Diagnosis not present

## 2020-11-12 DIAGNOSIS — M9903 Segmental and somatic dysfunction of lumbar region: Secondary | ICD-10-CM | POA: Diagnosis not present

## 2020-11-14 DIAGNOSIS — M9901 Segmental and somatic dysfunction of cervical region: Secondary | ICD-10-CM | POA: Diagnosis not present

## 2020-11-14 DIAGNOSIS — M545 Low back pain, unspecified: Secondary | ICD-10-CM | POA: Diagnosis not present

## 2020-11-14 DIAGNOSIS — M9903 Segmental and somatic dysfunction of lumbar region: Secondary | ICD-10-CM | POA: Diagnosis not present

## 2020-11-14 DIAGNOSIS — M9905 Segmental and somatic dysfunction of pelvic region: Secondary | ICD-10-CM | POA: Diagnosis not present

## 2020-11-19 DIAGNOSIS — M545 Low back pain, unspecified: Secondary | ICD-10-CM | POA: Diagnosis not present

## 2020-11-19 DIAGNOSIS — M9905 Segmental and somatic dysfunction of pelvic region: Secondary | ICD-10-CM | POA: Diagnosis not present

## 2020-11-19 DIAGNOSIS — M9901 Segmental and somatic dysfunction of cervical region: Secondary | ICD-10-CM | POA: Diagnosis not present

## 2020-11-19 DIAGNOSIS — M9903 Segmental and somatic dysfunction of lumbar region: Secondary | ICD-10-CM | POA: Diagnosis not present

## 2020-11-21 DIAGNOSIS — M9905 Segmental and somatic dysfunction of pelvic region: Secondary | ICD-10-CM | POA: Diagnosis not present

## 2020-11-21 DIAGNOSIS — M9901 Segmental and somatic dysfunction of cervical region: Secondary | ICD-10-CM | POA: Diagnosis not present

## 2020-11-21 DIAGNOSIS — M9903 Segmental and somatic dysfunction of lumbar region: Secondary | ICD-10-CM | POA: Diagnosis not present

## 2020-11-21 DIAGNOSIS — M545 Low back pain, unspecified: Secondary | ICD-10-CM | POA: Diagnosis not present

## 2020-11-26 DIAGNOSIS — M545 Low back pain, unspecified: Secondary | ICD-10-CM | POA: Diagnosis not present

## 2020-11-26 DIAGNOSIS — M9901 Segmental and somatic dysfunction of cervical region: Secondary | ICD-10-CM | POA: Diagnosis not present

## 2020-11-26 DIAGNOSIS — M9903 Segmental and somatic dysfunction of lumbar region: Secondary | ICD-10-CM | POA: Diagnosis not present

## 2020-11-26 DIAGNOSIS — M9905 Segmental and somatic dysfunction of pelvic region: Secondary | ICD-10-CM | POA: Diagnosis not present

## 2020-11-28 DIAGNOSIS — M9901 Segmental and somatic dysfunction of cervical region: Secondary | ICD-10-CM | POA: Diagnosis not present

## 2020-11-28 DIAGNOSIS — M9905 Segmental and somatic dysfunction of pelvic region: Secondary | ICD-10-CM | POA: Diagnosis not present

## 2020-11-28 DIAGNOSIS — M9903 Segmental and somatic dysfunction of lumbar region: Secondary | ICD-10-CM | POA: Diagnosis not present

## 2020-11-28 DIAGNOSIS — M545 Low back pain, unspecified: Secondary | ICD-10-CM | POA: Diagnosis not present

## 2020-12-10 DIAGNOSIS — M545 Low back pain, unspecified: Secondary | ICD-10-CM | POA: Diagnosis not present

## 2020-12-10 DIAGNOSIS — M9901 Segmental and somatic dysfunction of cervical region: Secondary | ICD-10-CM | POA: Diagnosis not present

## 2020-12-10 DIAGNOSIS — M9905 Segmental and somatic dysfunction of pelvic region: Secondary | ICD-10-CM | POA: Diagnosis not present

## 2020-12-10 DIAGNOSIS — M9903 Segmental and somatic dysfunction of lumbar region: Secondary | ICD-10-CM | POA: Diagnosis not present

## 2020-12-12 DIAGNOSIS — M9903 Segmental and somatic dysfunction of lumbar region: Secondary | ICD-10-CM | POA: Diagnosis not present

## 2020-12-12 DIAGNOSIS — M9905 Segmental and somatic dysfunction of pelvic region: Secondary | ICD-10-CM | POA: Diagnosis not present

## 2020-12-12 DIAGNOSIS — M545 Low back pain, unspecified: Secondary | ICD-10-CM | POA: Diagnosis not present

## 2020-12-12 DIAGNOSIS — M9901 Segmental and somatic dysfunction of cervical region: Secondary | ICD-10-CM | POA: Diagnosis not present

## 2020-12-17 DIAGNOSIS — M9903 Segmental and somatic dysfunction of lumbar region: Secondary | ICD-10-CM | POA: Diagnosis not present

## 2020-12-17 DIAGNOSIS — M9905 Segmental and somatic dysfunction of pelvic region: Secondary | ICD-10-CM | POA: Diagnosis not present

## 2020-12-17 DIAGNOSIS — M545 Low back pain, unspecified: Secondary | ICD-10-CM | POA: Diagnosis not present

## 2020-12-17 DIAGNOSIS — M9901 Segmental and somatic dysfunction of cervical region: Secondary | ICD-10-CM | POA: Diagnosis not present

## 2020-12-19 DIAGNOSIS — M9903 Segmental and somatic dysfunction of lumbar region: Secondary | ICD-10-CM | POA: Diagnosis not present

## 2020-12-19 DIAGNOSIS — M9901 Segmental and somatic dysfunction of cervical region: Secondary | ICD-10-CM | POA: Diagnosis not present

## 2020-12-19 DIAGNOSIS — M545 Low back pain, unspecified: Secondary | ICD-10-CM | POA: Diagnosis not present

## 2020-12-19 DIAGNOSIS — M9905 Segmental and somatic dysfunction of pelvic region: Secondary | ICD-10-CM | POA: Diagnosis not present

## 2020-12-24 DIAGNOSIS — M9905 Segmental and somatic dysfunction of pelvic region: Secondary | ICD-10-CM | POA: Diagnosis not present

## 2020-12-24 DIAGNOSIS — M9903 Segmental and somatic dysfunction of lumbar region: Secondary | ICD-10-CM | POA: Diagnosis not present

## 2020-12-24 DIAGNOSIS — M545 Low back pain, unspecified: Secondary | ICD-10-CM | POA: Diagnosis not present

## 2020-12-24 DIAGNOSIS — M9901 Segmental and somatic dysfunction of cervical region: Secondary | ICD-10-CM | POA: Diagnosis not present

## 2020-12-26 DIAGNOSIS — M9905 Segmental and somatic dysfunction of pelvic region: Secondary | ICD-10-CM | POA: Diagnosis not present

## 2020-12-26 DIAGNOSIS — M9901 Segmental and somatic dysfunction of cervical region: Secondary | ICD-10-CM | POA: Diagnosis not present

## 2020-12-26 DIAGNOSIS — M545 Low back pain, unspecified: Secondary | ICD-10-CM | POA: Diagnosis not present

## 2020-12-26 DIAGNOSIS — M9903 Segmental and somatic dysfunction of lumbar region: Secondary | ICD-10-CM | POA: Diagnosis not present

## 2021-01-23 DIAGNOSIS — M25511 Pain in right shoulder: Secondary | ICD-10-CM | POA: Diagnosis not present

## 2021-02-08 DIAGNOSIS — M25511 Pain in right shoulder: Secondary | ICD-10-CM | POA: Diagnosis not present

## 2021-02-14 DIAGNOSIS — M25511 Pain in right shoulder: Secondary | ICD-10-CM | POA: Diagnosis not present

## 2021-02-19 DIAGNOSIS — M25511 Pain in right shoulder: Secondary | ICD-10-CM | POA: Diagnosis not present

## 2021-02-20 ENCOUNTER — Other Ambulatory Visit: Payer: Self-pay | Admitting: Physician Assistant

## 2021-02-20 DIAGNOSIS — K219 Gastro-esophageal reflux disease without esophagitis: Secondary | ICD-10-CM

## 2021-02-21 ENCOUNTER — Telehealth: Payer: BC Managed Care – PPO

## 2021-02-22 ENCOUNTER — Telehealth: Payer: BC Managed Care – PPO | Admitting: Family Medicine

## 2021-02-22 DIAGNOSIS — B379 Candidiasis, unspecified: Secondary | ICD-10-CM | POA: Diagnosis not present

## 2021-02-22 DIAGNOSIS — B9689 Other specified bacterial agents as the cause of diseases classified elsewhere: Secondary | ICD-10-CM | POA: Diagnosis not present

## 2021-02-22 DIAGNOSIS — J019 Acute sinusitis, unspecified: Secondary | ICD-10-CM | POA: Diagnosis not present

## 2021-02-22 DIAGNOSIS — T3695XA Adverse effect of unspecified systemic antibiotic, initial encounter: Secondary | ICD-10-CM

## 2021-02-22 DIAGNOSIS — R051 Acute cough: Secondary | ICD-10-CM | POA: Diagnosis not present

## 2021-02-22 MED ORDER — BENZONATATE 100 MG PO CAPS: 100.0000 mg | ORAL_CAPSULE | Freq: Two times a day (BID) | ORAL | 0 refills | Status: DC | PRN

## 2021-02-22 MED ORDER — AMOXICILLIN-POT CLAVULANATE 875-125 MG PO TABS: 1.0000 | ORAL_TABLET | Freq: Two times a day (BID) | ORAL | 0 refills | Status: AC

## 2021-02-22 MED ORDER — FLUTICASONE PROPIONATE 50 MCG/ACT NA SUSP: 2.0000 | Freq: Every day | NASAL | 0 refills | Status: DC

## 2021-02-22 MED ORDER — FLUCONAZOLE 150 MG PO TABS: 150.0000 mg | ORAL_TABLET | Freq: Once | ORAL | 0 refills | Status: AC

## 2021-02-22 NOTE — Patient Instructions (Signed)
Today you were seen for URI/Sinus Infection.  Please take the prescribed medication as directed and complete the full dose to prevent reinfection.  As suggested for symptom management use Nasal saline spray to help ease congestion and dry nasal passages. Can be used throughout the day as needed. Avoid forceful blowing of nose. It will be common to have some blood if blowing nose a lot or if nose is very dry. Can you a humidifier at home as well. Remember to wash and air it out regularly. Tylenol (325 mg 2 tablets every 6 hours) and or Ibuprofen (200- 400 mg every 8 hours) for fever, sore throat and body aches. Can consider use of a Neti-pot to wash out sinus cavity (twice daily). Please be aware that you need to use distilled or boiled water for these. If congestion is increasing and or coughing can use Mucinex (twice daily) for cough and congestion with full glass of water.  If you have a sore throat warm fluids like hot tea or lemon water can help sooth this.    Please hydrate, rest, get fresh air daily and wash your hands well.  I hope you feel better soon.   Sinusitis, Adult Sinusitis is soreness and swelling (inflammation) of your sinuses. Sinuses are hollow spaces in the bones around your face. They are located: Around your eyes. In the middle of your forehead. Behind your nose. In your cheekbones. Your sinuses and nasal passages are lined with a fluid called mucus. Mucus drains out of your sinuses. Swelling can trap mucus in your sinuses. This lets germs (bacteria, virus, or fungus) grow, which leads to infection. Most of the time, this condition is caused by a virus. What are the causes? This condition is caused by: Allergies. Asthma. Germs. Things that block your nose or sinuses. Growths in the nose (nasal polyps). Chemicals or irritants in the air. Fungus (rare). What increases the risk? You are more likely to develop this condition if: You have a weak body defense  system (immune system). You do a lot of swimming or diving. You use nasal sprays too much. You smoke. What are the signs or symptoms? The main symptoms of this condition are pain and a feeling of pressure around the sinuses. Other symptoms include: Stuffy nose (congestion). Runny nose (drainage). Swelling and warmth in the sinuses. Headache. Toothache. A cough that may get worse at night. Mucus that collects in the throat or the back of the nose (postnasal drip). Being unable to smell and taste. Being very tired (fatigue). A fever. Sore throat. Bad breath. How is this diagnosed? This condition is diagnosed based on: Your symptoms. Your medical history. A physical exam. Tests to find out if your condition is short-term (acute) or long-term (chronic). Your doctor may: Check your nose for growths (polyps). Check your sinuses using a tool that has a light (endoscope). Check for allergies or germs. Do imaging tests, such as an MRI or CT scan. How is this treated? Treatment for this condition depends on the cause and whether it is short-term or long-term. If caused by a virus, your symptoms should go away on their own within 10 days. You may be given medicines to relieve symptoms. They include: Medicines that shrink swollen tissue in the nose. Medicines that treat allergies (antihistamines). A spray that treats swelling of the nostrils.  Rinses that help get rid of thick mucus in your nose (nasal saline washes). If caused by bacteria, your doctor may wait to see if you will  get better without treatment. You may be given antibiotic medicine if you have: A very bad infection. A weak body defense system. If caused by growths in the nose, you may need to have surgery. Follow these instructions at home: Medicines Take, use, or apply over-the-counter and prescription medicines only as told by your doctor. These may include nasal sprays. If you were prescribed an antibiotic medicine,  take it as told by your doctor. Do not stop taking the antibiotic even if you start to feel better. Hydrate and humidify  Drink enough water to keep your pee (urine) pale yellow. Use a cool mist humidifier to keep the humidity level in your home above 50%. Breathe in steam for 10-15 minutes, 3-4 times a day, or as told by your doctor. You can do this in the bathroom while a hot shower is running. Try not to spend time in cool or dry air. Rest Rest as much as you can. Sleep with your head raised (elevated). Make sure you get enough sleep each night. General instructions  Put a warm, moist washcloth on your face 3-4 times a day, or as often as told by your doctor. This will help with discomfort. Wash your hands often with soap and water. If there is no soap and water, use hand sanitizer. Do not smoke. Avoid being around people who are smoking (secondhand smoke). Keep all follow-up visits as told by your doctor. This is important. Contact a doctor if: You have a fever. Your symptoms get worse. Your symptoms do not get better within 10 days. Get help right away if: You have a very bad headache. You cannot stop throwing up (vomiting). You have very bad pain or swelling around your face or eyes. You have trouble seeing. You feel confused. Your neck is stiff. You have trouble breathing. Summary Sinusitis is swelling of your sinuses. Sinuses are hollow spaces in the bones around your face. This condition is caused by tissues in your nose that become inflamed or swollen. This traps germs. These can lead to infection. If you were prescribed an antibiotic medicine, take it as told by your doctor. Do not stop taking it even if you start to feel better. Keep all follow-up visits as told by your doctor. This is important. This information is not intended to replace advice given to you by your health care provider. Make sure you discuss any questions you have with your health care  provider. Document Revised: 06/01/2017 Document Reviewed: 06/01/2017 Elsevier Patient Education  2022 [employer].

## 2021-02-22 NOTE — Progress Notes (Signed)
Virtual Visit Consent   Jill Lloyd, you are scheduled for a virtual visit with a [facility] provider today.     Just as with appointments in the office, your consent must be obtained to participate.  Your consent will be active for this visit and any virtual visit you may have with one of our providers in the next 365 days.     If you have a MyChart account, a copy of this consent can be sent to you electronically.  All virtual visits are billed to your insurance company just like a traditional visit in the office.    As this is a virtual visit, video technology does not allow for your provider to perform a traditional examination.  This may limit your provider's ability to fully assess your condition.  If your provider identifies any concerns that need to be evaluated in person or the need to arrange testing (such as labs, EKG, etc.), we will make arrangements to do so.     Although advances in technology are sophisticated, we cannot ensure that it will always work on either your end or our end.  If the connection with a video visit is poor, the visit may have to be switched to a telephone visit.  With either a video or telephone visit, we are not always able to ensure that we have a secure connection.     I need to obtain your verbal consent now.   Are you willing to proceed with your visit today?    Jill Lloyd has provided verbal consent on 02/22/2021 for a virtual visit (video or telephone).   Perlie Mayo, NP   Date: 02/22/2021 1:36 PM   Virtual Visit via Video Note   I, Perlie Mayo, connected with  Jill Lloyd  ([id number], 1980-11-08) on 02/22/21 at  1:30 PM EST by a video-enabled telemedicine application and verified that I am speaking with the correct person using two identifiers.  Location: Patient: Virtual Visit Location Patient: Home Provider: Virtual Visit Location Provider: Home Office   I discussed the limitations of evaluation and management by telemedicine  and the availability of in person appointments. The patient expressed understanding and agreed to proceed.    History of Present Illness: Jill Lloyd is a 41 y.o. who identifies as a female who was assigned female at birth, and is being seen today for sinus infection, day 9 of covid continues to feel poorly. Hx of COVID with sinus infection last year.   HPI: Sinus Problem The current episode started 1 to 4 weeks ago. The problem has been gradually worsening since onset. There has been no fever. Associated symptoms include congestion, coughing, ear pain, headaches, sinus pressure and sneezing. Pertinent negatives include no chills, neck pain, shortness of breath, sore throat or swollen glands. Past treatments include oral decongestants (mucinex). The treatment provided mild relief.   Problems:  Patient Active Problem List   Diagnosis Date Noted   Gross hematuria 06/19/2020   Acute cystitis with hematuria 06/19/2020   Gastroesophageal reflux disease 03/19/2020   Chronic cystitis 03/21/2019   Lipoma of right forearm 12/28/2018   Family history of breast cancer 12/15/2018   Numbness and tingling in right hand 12/15/2018   Class 2 obesity due to excess calories without serious comorbidity with body mass index (BMI) of 37.0 to 37.9 in adult 12/13/2018   Frequent UTI 12/13/2018   Arm mass, right 12/13/2018   Dysthymia 12/13/2018    Allergies:  Allergies  Allergen Reactions  Phentermine Hcl Shortness Of Breath   Medications:  Current Outpatient Medications:    Multiple Vitamin (MULTI-VITAMIN PO), Take by mouth., Disp: , Rfl:    nitrofurantoin, macrocrystal-monohydrate, (MACROBID) 100 MG capsule, Take 1 capsule (100 mg total) by mouth 2 (two) times daily. For 5 days., Disp: 10 capsule, Rfl: 0   omeprazole (PRILOSEC) 40 MG capsule, TAKE 1 CAPSULE BY MOUTH EVERY DAY/ needs appt, Disp: 90 capsule, Rfl: 0   phenazopyridine (PYRIDIUM) 100 MG tablet, Take by mouth., Disp: , Rfl:    Observations/Objective: Patient is well-developed, well-nourished in no acute distress.  Resting comfortably  at home.  Head is normocephalic, atraumatic.  No labored breathing.  Speech is clear and coherent with logical content.  Patient is alert and oriented at baseline.    Assessment and Plan:  1. Acute bacterial sinusitis  - amoxicillin-clavulanate (AUGMENTIN) 875-125 MG tablet; Take 1 tablet by mouth 2 (two) times daily for 7 days.  Dispense: 14 tablet; Refill: 0 - fluticasone (FLONASE) 50 MCG/ACT nasal spray; Place 2 sprays into both nostrils daily.  Dispense: 16 g; Refill: 0  2. Antibiotic-induced yeast infection  - fluconazole (DIFLUCAN) 150 MG tablet; Take 1 tablet (150 mg total) by mouth once for 1 dose.  Dispense: 1 tablet; Refill: 0  3. Acute cough  - benzonatate (TESSALON) 100 MG capsule; Take 1 capsule (100 mg total) by mouth 2 (two) times daily as needed for cough.  Dispense: 20 capsule; Refill: 0  Secondary sinus infection in the setting post covid Cough and sinus symptoms  Tx per above OTC reviewed and on AVS   Reviewed side effects, risks and benefits of medication.    Patient acknowledged agreement and understanding of the plan.   I discussed the assessment and treatment plan with the patient. The patient was provided an opportunity to ask questions and all were answered. The patient agreed with the plan and demonstrated an understanding of the instructions.   The patient was advised to call back or seek an in-person evaluation if the symptoms worsen or if the condition fails to improve as anticipated.   The above assessment and management plan was discussed with the patient. The patient verbalized understanding of and has agreed to the management plan. Patient is aware to call the clinic if symptoms persist or worsen. Patient is aware when to return to the clinic for a follow-up visit. Patient educated on when it is appropriate to go to the emergency  department.   I discussed the assessment and treatment plan with the patient. The patient was provided an opportunity to ask questions and all were answered. The patient agreed with the plan and demonstrated an understanding of the instructions.   The patient was advised to call back or seek an in-person evaluation if the symptoms worsen or if the condition fails to improve as anticipated.   The above assessment and management plan was discussed with the patient. The patient verbalized understanding of and has agreed to the management plan. Patient is aware to call the clinic if symptoms persist or worsen. Patient is aware when to return to the clinic for a follow-up visit. Patient educated on when it is appropriate to go to the emergency department.    Follow Up Instructions: I discussed the assessment and treatment plan with the patient. The patient was provided an opportunity to ask questions and all were answered. The patient agreed with the plan and demonstrated an understanding of the instructions.  A copy of instructions were sent  to the patient via MyChart unless otherwise noted below.     The patient was advised to call back or seek an in-person evaluation if the symptoms worsen or if the condition fails to improve as anticipated.  Time:  I spent 10 minutes with the patient via telehealth technology discussing the above problems/concerns.    Perlie Mayo, NP

## 2021-02-26 DIAGNOSIS — M25511 Pain in right shoulder: Secondary | ICD-10-CM | POA: Diagnosis not present

## 2021-02-28 DIAGNOSIS — M25511 Pain in right shoulder: Secondary | ICD-10-CM | POA: Diagnosis not present

## 2021-03-04 DIAGNOSIS — M25511 Pain in right shoulder: Secondary | ICD-10-CM | POA: Diagnosis not present

## 2021-03-06 DIAGNOSIS — M25511 Pain in right shoulder: Secondary | ICD-10-CM | POA: Diagnosis not present

## 2021-03-11 DIAGNOSIS — M25511 Pain in right shoulder: Secondary | ICD-10-CM | POA: Diagnosis not present

## 2021-03-14 DIAGNOSIS — M25511 Pain in right shoulder: Secondary | ICD-10-CM | POA: Diagnosis not present

## 2021-03-18 DIAGNOSIS — M25511 Pain in right shoulder: Secondary | ICD-10-CM | POA: Diagnosis not present

## 2021-03-21 DIAGNOSIS — M25511 Pain in right shoulder: Secondary | ICD-10-CM | POA: Diagnosis not present

## 2021-03-25 ENCOUNTER — Other Ambulatory Visit: Payer: Self-pay

## 2021-03-25 ENCOUNTER — Ambulatory Visit (INDEPENDENT_AMBULATORY_CARE_PROVIDER_SITE_OTHER): Payer: BC Managed Care – PPO | Admitting: Physician Assistant

## 2021-03-25 ENCOUNTER — Encounter: Payer: Self-pay | Admitting: Physician Assistant

## 2021-03-25 VITALS — BP 138/83 | HR 69 | Ht 64.0 in | Wt 226.0 lb

## 2021-03-25 DIAGNOSIS — Z0000 Encounter for general adult medical examination without abnormal findings: Secondary | ICD-10-CM

## 2021-03-25 DIAGNOSIS — Z131 Encounter for screening for diabetes mellitus: Secondary | ICD-10-CM | POA: Diagnosis not present

## 2021-03-25 DIAGNOSIS — Z1159 Encounter for screening for other viral diseases: Secondary | ICD-10-CM

## 2021-03-25 DIAGNOSIS — N3020 Other chronic cystitis without hematuria: Secondary | ICD-10-CM

## 2021-03-25 DIAGNOSIS — Z13220 Encounter for screening for lipoid disorders: Secondary | ICD-10-CM

## 2021-03-25 DIAGNOSIS — E6609 Other obesity due to excess calories: Secondary | ICD-10-CM

## 2021-03-25 DIAGNOSIS — K219 Gastro-esophageal reflux disease without esophagitis: Secondary | ICD-10-CM

## 2021-03-25 DIAGNOSIS — Z803 Family history of malignant neoplasm of breast: Secondary | ICD-10-CM

## 2021-03-25 DIAGNOSIS — Z6838 Body mass index (BMI) 38.0-38.9, adult: Secondary | ICD-10-CM

## 2021-03-25 DIAGNOSIS — Z1329 Encounter for screening for other suspected endocrine disorder: Secondary | ICD-10-CM

## 2021-03-25 MED ORDER — OMEPRAZOLE 40 MG PO CPDR: DELAYED_RELEASE_CAPSULE | ORAL | 3 refills | Status: DC

## 2021-03-25 MED ORDER — PHENAZOPYRIDINE HCL 200 MG PO TABS: ORAL_TABLET | ORAL | 1 refills | Status: DC

## 2021-03-25 MED ORDER — WEGOVY 0.25 MG/0.5ML ~~LOC~~ SOAJ: 0.2500 mg | SUBCUTANEOUS | 0 refills | Status: DC

## 2021-03-25 NOTE — Progress Notes (Signed)
Subjective:  ?  ? Jill Lloyd is a 41 y.o. female and is here for a comprehensive physical exam. The patient reports problems - she would like refills on medication. She would like to discuss weight.  ? ?Being seen for labrum tear of right shoulder by Ortho.  ? ? ?Social History  ? ?Socioeconomic History  ? Marital status: Married  ?  Spouse name: Not on file  ? Number of children: Not on file  ? Years of education: Not on file  ? Highest education level: Not on file  ?Occupational History  ? Not on file  ?Tobacco Use  ? Smoking status: Former  ?  Types: Cigarettes  ?  Quit date: 01/13/2009  ?  Years since quitting: 12.2  ? Smokeless tobacco: Never  ?Substance and Sexual Activity  ? Alcohol use: Yes  ?  Comment: rarely  ? Drug use: Never  ? Sexual activity: Yes  ?  Partners: Male  ?  Birth control/protection: Surgical  ?  Comment: husband - vasectomy  ?Other Topics Concern  ? Not on file  ?Social History Narrative  ? Not on file  ? ?Social Determinants of Health  ? ?Financial Resource Strain: Not on file  ?Food Insecurity: Not on file  ?Transportation Needs: Not on file  ?Physical Activity: Not on file  ?Stress: Not on file  ?Social Connections: Not on file  ?Intimate Partner Violence: Not on file  ? ?Health Maintenance  ?Topic Date Due  ? Hepatitis C Screening  Never done  ? MAMMOGRAM  06/05/2021  ? TETANUS/TDAP  08/17/2021  ? PAP SMEAR-Modifier  12/12/2021  ? HIV Screening  Completed  ? HPV VACCINES  Aged Out  ? INFLUENZA VACCINE  Discontinued  ? COVID-19 Vaccine  Discontinued  ? ? ?The following portions of the patient's history were reviewed and updated as appropriate: allergies, current medications, past family history, past medical history, past social history, past surgical history, and problem list. ? ?Review of Systems ?A comprehensive review of systems was negative.  ? ?Objective:  ? ? BP (!) 144/78   Pulse 68   Ht '5\' 4"'  (1.626 m)   Wt 226 lb (102.5 kg)   SpO2 99%   BMI 38.79 kg/m?  ?General  appearance: alert, cooperative, and appears stated age ?Head: Normocephalic, without obvious abnormality, atraumatic ?Eyes: conjunctivae/corneas clear. PERRL, EOM's intact. Fundi benign. ?Ears: normal TM's and external ear canals both ears ?Nose: Nares normal. Septum midline. Mucosa normal. No drainage or sinus tenderness. ?Throat: lips, mucosa, and tongue normal; teeth and gums normal ?Neck: no adenopathy, no carotid bruit, no JVD, supple, symmetrical, trachea midline, and thyroid not enlarged, symmetric, no tenderness/mass/nodules ?Back: symmetric, no curvature. ROM normal. No CVA tenderness. ?Lungs: clear to auscultation bilaterally ?Heart: regular rate and rhythm, S1, S2 normal, no murmur, click, rub or gallop ?Abdomen: soft, non-tender; bowel sounds normal; no masses,  no organomegaly ?Extremities: extremities normal, atraumatic, no cyanosis or edema ?Pulses: 2+ and symmetric ?Skin: Skin color, texture, turgor normal. No rashes or lesions ?Lymph nodes: Cervical, supraclavicular, and axillary nodes normal. ?Neurologic: Alert and oriented X 3, normal strength and tone. Normal symmetric reflexes. Normal coordination and gait  ? Marland KitchenMarland Kitchen ?Depression screen [facility] 2/9 03/25/2021 03/19/2020 12/13/2018  ?Decreased Interest 0 0 0  ?Down, Depressed, Hopeless 0 0 0  ?PHQ - 2 Score 0 0 0  ?Altered sleeping - 0 0  ?Tired, decreased energy - 0 0  ?Change in appetite - 0 0  ?Feeling bad or failure about  yourself  - 0 0  ?Trouble concentrating - 0 0  ?Moving slowly or fidgety/restless - 0 0  ?Suicidal thoughts - 0 0  ?PHQ-9 Score - 0 0  ?Difficult doing work/chores - Not difficult at all Not difficult at all  ? ? ?Assessment:  ? ? Healthy female exam.   ?  ?Plan:  ? ? Marland KitchenMarland KitchenLauren was seen today for annual exam. ? ?Diagnoses and all orders for this visit: ? ?Routine physical examination ?-     TSH ?-     Lipid Panel w/reflex Direct LDL ?-     COMPLETE METABOLIC PANEL WITH GFR ?-     CBC with Differential/Platelet ? ?Screening for lipid  disorders ?-     Lipid Panel w/reflex Direct LDL ? ?Screening for thyroid disorder ?-     TSH ? ?Screening for diabetes mellitus ?-     COMPLETE METABOLIC PANEL WITH GFR ? ?Class 2 obesity due to excess calories without serious comorbidity with body mass index (BMI) of 38.0 to 38.9 in adult ?-     WEGOVY 0.25 MG/0.5ML SOAJ; Inject 0.25 mg into the skin once a week. Use this dose for 1 month (4 shots) and then increase to next higher dose. ? ?Gastroesophageal reflux disease, unspecified whether esophagitis present ?-     omeprazole (PRILOSEC) 40 MG capsule; TAKE 1 CAPSULE BY MOUTH EVERY DAY. ? ?Encounter for hepatitis C screening test for low risk patient ?-     Hepatitis C antibody ? ?Chronic cystitis ?-     phenazopyridine (PYRIDIUM) 200 MG tablet; Take one tablet as needed for dysuria. ? ?Family history of breast cancer ? ? ?.. ?Discussed 150 minutes of exercise a week.  ?Encouraged vitamin D 1000 units and Calcium 1396m or 4 servings of dairy a day.  ?Fasting labs ordered.  ?PHQ no concerns.  ?Pap and mammogram done with GYN.  ?Family hx of BC. Consider genetic testing with myriad. HO given.  ?Declined covid and flu vaccine.  ? ?GERD-omeprazole refilled.  ? ?Chronic cystitis- as needed pyridium.  ? ?..Discussed low carb diet with 1500 calories and 80g of protein.  ?Exercising at least 150 minutes a week.  ?My Fitness Pal could be a gMicrobiologist  ?Sent wegovy. Discussed side effects and titration up.  ?Follow up in 3 months.  ? ?See After Visit Summary for Counseling Recommendations  ? ?

## 2021-03-25 NOTE — Patient Instructions (Addendum)
Semaglutide Injection (Weight Management) What is this medication? SEMAGLUTIDE (SEM a GLOO tide) promotes weight loss. It may also be used to maintain weight loss. It works by decreasing appetite. Changes to diet and exercise are often combined with this medication. This medicine may be used for other purposes; ask your health care provider or pharmacist if you have questions. COMMON BRAND NAME(S): [id number] What should I tell my care team before I take this medication? They need to know if you have any of these conditions: Endocrine tumors (MEN 2) or if someone in your family had these tumors Eye disease, vision problems Gallbladder disease History of depression or mental health disease History of pancreatitis Kidney disease Stomach or intestine problems Suicidal thoughts, plans, or attempt; a previous suicide attempt by you or a family member Thyroid cancer or if someone in your family had thyroid cancer An unusual or allergic reaction to semaglutide, other medications, foods, dyes, or preservatives Pregnant or trying to get pregnant Breast-feeding How should I use this medication? This medication is injected under the skin. You will be taught how to prepare and give it. Take it as directed on the prescription label. It is given once every week (every 7 days). Keep taking it unless your care team tells you to stop. It is important that you put your used needles and pens in a special sharps container. Do not put them in a trash can. If you do not have a sharps container, call your pharmacist or care team to get one. A special MedGuide will be given to you by the pharmacist with each prescription and refill. Be sure to read this information carefully each time. This medication comes with INSTRUCTIONS FOR USE. Ask your pharmacist for directions on how to use this medication. Read the information carefully. Talk to your pharmacist or care team if you have questions. Talk to your care team about  the use of this medication in children. Special care may be needed. Overdosage: If you think you have taken too much of this medicine contact a poison control center or emergency room at once. NOTE: This medicine is only for you. Do not share this medicine with others. What if I miss a dose? If you miss a dose and the next scheduled dose is more than 2 days away, take the missed dose as soon as possible. If you miss a dose and the next scheduled dose is less than 2 days away, do not take the missed dose. Take the next dose at your regular time. Do not take double or extra doses. If you miss your dose for 2 weeks or more, take the next dose at your regular time or call your care team to talk about how to restart this medication. What may interact with this medication? Insulin and other medications for diabetes This list may not describe all possible interactions. Give your health care provider a list of all the medicines, herbs, non-prescription drugs, or dietary supplements you use. Also tell them if you smoke, drink alcohol, or use illegal drugs. Some items may interact with your medicine. What should I watch for while using this medication? Visit your care team for regular checks on your progress. It may be some time before you see the benefit from this medication. Drink plenty of fluids while taking this medication. Check with your care team if you have severe diarrhea, nausea, and vomiting, or if you sweat a lot. The loss of too much body fluid may make it dangerous for  you to take this medication. This medication may affect blood sugar levels. Ask your care team if changes in diet or medications are needed if you have diabetes. If you or your family notice any changes in your behavior, such as new or worsening depression, thoughts of harming yourself, anxiety, other unusual or disturbing thoughts, or memory loss, call your care team right away. Women should inform their care team if they wish to  become pregnant or think they might be pregnant. Losing weight while pregnant is not advised and may cause harm to the unborn child. Talk to your care team for more information. What side effects may I notice from receiving this medication? Side effects that you should report to your care team as soon as possible: Allergic reactions--skin rash, itching, hives, swelling of the face, lips, tongue, or throat Change in vision Dehydration--increased thirst, dry mouth, feeling faint or lightheaded, headache, dark yellow or brown urine Gallbladder problems--severe stomach pain, nausea, vomiting, fever Heart palpitations--rapid, pounding, or irregular heartbeat Kidney injury--decrease in the amount of urine, swelling of the ankles, hands, or feet Pancreatitis--severe stomach pain that spreads to your back or gets worse after eating or when touched, fever, nausea, vomiting Thoughts of suicide or self-harm, worsening mood, feelings of depression Thyroid cancer--new mass or lump in the neck, pain or trouble swallowing, trouble breathing, hoarseness Side effects that usually do not require medical attention (report to your care team if they continue or are bothersome): Diarrhea Loss of appetite Nausea Stomach pain Vomiting This list may not describe all possible side effects. Call your doctor for medical advice about side effects. You may report side effects to FDA at 1-800-FDA-1088. Where should I keep my medication? Keep out of the reach of children and pets. Refrigeration (preferred): Store in the refrigerator. Do not freeze. Keep this medication in the original container until you are ready to take it. Get rid of any unused medication after the expiration date. Room temperature: If needed, prior to cap removal, the pen can be stored at room temperature for up to 28 days. Protect from light. If it is stored at room temperature, get rid of any unused medication after 28 days or after it expires,  whichever is first. It is important to get rid of the medication as soon as you no longer need it or it is expired. You can do this in two ways: Take the medication to a medication take-back program. Check with your pharmacy or law enforcement to find a location. If you cannot return the medication, follow the directions in the [place]. NOTE: This sheet is a summary. It may not cover all possible information. If you have questions about this medicine, talk to your doctor, pharmacist, or health care provider.  2022 Elsevier/Gold Standard (2020-04-06 00:00:00)   Health Maintenance, Female Adopting a healthy lifestyle and getting preventive care are important in promoting health and wellness. Ask your health care provider about: The right schedule for you to have regular tests and exams. Things you can do on your own to prevent diseases and keep yourself healthy. What should I know about diet, weight, and exercise? Eat a healthy diet  Eat a diet that includes plenty of vegetables, fruits, low-fat dairy products, and lean protein. Do not eat a lot of foods that are high in solid fats, added sugars, or sodium. Maintain a healthy weight Body mass index (BMI) is used to identify weight problems. It estimates body fat based on height and weight. Your health care provider  can help determine your BMI and help you achieve or maintain a healthy weight. Get regular exercise Get regular exercise. This is one of the most important things you can do for your health. Most adults should: Exercise for at least 150 minutes each week. The exercise should increase your heart rate and make you sweat (moderate-intensity exercise). Do strengthening exercises at least twice a week. This is in addition to the moderate-intensity exercise. Spend less time sitting. Even light physical activity can be beneficial. Watch cholesterol and blood lipids Have your blood tested for lipids and cholesterol at 41 years of age,  then have this test every 5 years. Have your cholesterol levels checked more often if: Your lipid or cholesterol levels are high. You are older than 41 years of age. You are at high risk for heart disease. What should I know about cancer screening? Depending on your health history and family history, you may need to have cancer screening at various ages. This may include screening for: Breast cancer. Cervical cancer. Colorectal cancer. Skin cancer. Lung cancer. What should I know about heart disease, diabetes, and high blood pressure? Blood pressure and heart disease High blood pressure causes heart disease and increases the risk of stroke. This is more likely to develop in people who have high blood pressure readings or are overweight. Have your blood pressure checked: Every 3-5 years if you are 59-4 years of age. Every year if you are 47 years old or older. Diabetes Have regular diabetes screenings. This checks your fasting blood sugar level. Have the screening done: Once every three years after age 76 if you are at a normal weight and have a low risk for diabetes. More often and at a younger age if you are overweight or have a high risk for diabetes. What should I know about preventing infection? Hepatitis B If you have a higher risk for hepatitis B, you should be screened for this virus. Talk with your health care provider to find out if you are at risk for hepatitis B infection. Hepatitis C Testing is recommended for: Everyone born from [birth year] through 1965. Anyone with known risk factors for hepatitis C. Sexually transmitted infections (STIs) Get screened for STIs, including gonorrhea and chlamydia, if: You are sexually active and are younger than 41 years of age. You are older than 41 years of age and your health care provider tells you that you are at risk for this type of infection. Your sexual activity has changed since you were last screened, and you are at increased risk  for chlamydia or gonorrhea. Ask your health care provider if you are at risk. Ask your health care provider about whether you are at high risk for HIV. Your health care provider may recommend a prescription medicine to help prevent HIV infection. If you choose to take medicine to prevent HIV, you should first get tested for HIV. You should then be tested every 3 months for as long as you are taking the medicine. Pregnancy If you are about to stop having your period (premenopausal) and you may become pregnant, seek counseling before you get pregnant. Take 400 to 800 micrograms (mcg) of folic acid every day if you become pregnant. Ask for birth control (contraception) if you want to prevent pregnancy. Osteoporosis and menopause Osteoporosis is a disease in which the bones lose minerals and strength with aging. This can result in bone fractures. If you are 67 years old or older, or if you are at risk for osteoporosis  and fractures, ask your health care provider if you should: Be screened for bone loss. Take a calcium or vitamin D supplement to lower your risk of fractures. Be given hormone replacement therapy (HRT) to treat symptoms of menopause. Follow these instructions at home: Alcohol use Do not drink alcohol if: Your health care provider tells you not to drink. You are pregnant, may be pregnant, or are planning to become pregnant. If you drink alcohol: Limit how much you have to: 0-1 drink a day. Know how much alcohol is in your drink. In the [place] one drink equals one 12 oz bottle of beer (355 mL), one 5 oz glass of wine (148 mL), or one 1 oz glass of hard liquor (44 mL). Lifestyle Do not use any products that contain nicotine or tobacco. These products include cigarettes, chewing tobacco, and vaping devices, such as e-cigarettes. If you need help quitting, ask your health care provider. Do not use street drugs. Do not share needles. Ask your health care provider for help if you need  support or information about quitting drugs. General instructions Schedule regular health, dental, and eye exams. Stay current with your vaccines. Tell your health care provider if: You often feel depressed. You have ever been abused or do not feel safe at home. Summary Adopting a healthy lifestyle and getting preventive care are important in promoting health and wellness. Follow your health care provider's instructions about healthy diet, exercising, and getting tested or screened for diseases. Follow your health care provider's instructions on monitoring your cholesterol and blood pressure. This information is not intended to replace advice given to you by your health care provider. Make sure you discuss any questions you have with your health care provider. Document Revised: 05/21/2020 Document Reviewed: 05/21/2020 Elsevier Patient Education  [employer].

## 2021-03-26 ENCOUNTER — Encounter: Payer: Self-pay | Admitting: Physician Assistant

## 2021-03-26 LAB — CBC WITH DIFFERENTIAL/PLATELET
Absolute Monocytes: 524 cells/uL (ref 200–950)
Basophils Absolute: 54 cells/uL (ref 0–200)
Basophils Relative: 0.7 %
Eosinophils Absolute: 123 cells/uL (ref 15–500)
Eosinophils Relative: 1.6 %
HCT: 40.4 % (ref 35.0–45.0)
Hemoglobin: 13.1 g/dL (ref 11.7–15.5)
Lymphs Abs: 2456 cells/uL (ref 850–3900)
MCH: 28.4 pg (ref 27.0–33.0)
MCHC: 32.4 g/dL (ref 32.0–36.0)
MCV: 87.6 fL (ref 80.0–100.0)
MPV: 10.5 fL (ref 7.5–12.5)
Monocytes Relative: 6.8 %
Neutro Abs: 4543 cells/uL (ref 1500–7800)
Neutrophils Relative %: 59 %
Platelets: 316 10*3/uL (ref 140–400)
RBC: 4.61 10*6/uL (ref 3.80–5.10)
RDW: 12.7 % (ref 11.0–15.0)
Total Lymphocyte: 31.9 %
WBC: 7.7 10*3/uL (ref 3.8–10.8)

## 2021-03-26 LAB — COMPLETE METABOLIC PANEL WITH GFR
AG Ratio: 1.6 (calc) (ref 1.0–2.5)
ALT: 51 U/L — ABNORMAL HIGH (ref 6–29)
AST: 31 U/L — ABNORMAL HIGH (ref 10–30)
Albumin: 4.5 g/dL (ref 3.6–5.1)
Alkaline phosphatase (APISO): 74 U/L (ref 31–125)
BUN: 10 mg/dL (ref 7–25)
CO2: 30 mmol/L (ref 20–32)
Calcium: 9.6 mg/dL (ref 8.6–10.2)
Chloride: 105 mmol/L (ref 98–110)
Creat: 0.74 mg/dL (ref 0.50–0.99)
Globulin: 2.9 g/dL (calc) (ref 1.9–3.7)
Glucose, Bld: 97 mg/dL (ref 65–99)
Potassium: 4.3 mmol/L (ref 3.5–5.3)
Sodium: 141 mmol/L (ref 135–146)
Total Bilirubin: 0.7 mg/dL (ref 0.2–1.2)
Total Protein: 7.4 g/dL (ref 6.1–8.1)
eGFR: 105 mL/min/{1.73_m2} (ref >=60)

## 2021-03-26 LAB — LIPID PANEL W/REFLEX DIRECT LDL
Cholesterol: 188 mg/dL (ref <200)
HDL: 53 mg/dL (ref >=50)
LDL Cholesterol (Calc): 118 mg/dL (calc) — ABNORMAL HIGH
Non-HDL Cholesterol (Calc): 135 mg/dL (calc) — ABNORMAL HIGH (ref <130)
Total CHOL/HDL Ratio: 3.5 (calc) (ref <5.0)
Triglycerides: 76 mg/dL (ref <150)

## 2021-03-26 LAB — HEPATITIS C ANTIBODY
Hepatitis C Ab: NONREACTIVE
SIGNAL TO CUT-OFF: <0.02 (ref <1.00)

## 2021-03-26 LAB — TSH: TSH: 1.76 mIU/L

## 2021-03-26 NOTE — Progress Notes (Signed)
Glucose, kidney look great.  ?Thyroid looks great.  ?Normal hemoglobin.  ?HDL, good cholesterol, is amazing.  ?LDL, bad cholesterol, improved some since a year ago.  ?Liver enzymes, AST and ALT, are up some.  Are you drinking alcohol regularly? Taking tylenol regularly?  ?Need to get abdominal ultrasound. Are you ok to order this?  ?

## 2021-03-28 ENCOUNTER — Telehealth: Payer: Self-pay

## 2021-03-28 DIAGNOSIS — M25511 Pain in right shoulder: Secondary | ICD-10-CM | POA: Diagnosis not present

## 2021-03-28 NOTE — Telephone Encounter (Addendum)
Initiated Prior authorization JLL:VDIXVE 0.'25MG'$ /0.5ML auto-injectors ?Via: Covermymeds ?Case/Key:[id number] ?Status: approved as of 03/28/21 ?Reason:Coverage Start Date:03/03/2021;Coverage End Date:10/29/2021 ?Notified Pt via: Mychart ?

## 2021-03-30 DIAGNOSIS — J029 Acute pharyngitis, unspecified: Secondary | ICD-10-CM | POA: Diagnosis not present

## 2021-04-01 ENCOUNTER — Encounter: Payer: Self-pay | Admitting: Physician Assistant

## 2021-04-01 ENCOUNTER — Telehealth: Payer: BC Managed Care – PPO | Admitting: Emergency Medicine

## 2021-04-01 DIAGNOSIS — J029 Acute pharyngitis, unspecified: Secondary | ICD-10-CM

## 2021-04-01 MED ORDER — LIDOCAINE VISCOUS HCL 2 % MT SOLN: 15.0000 mL | OROMUCOSAL | 0 refills | Status: DC | PRN

## 2021-04-01 NOTE — Progress Notes (Addendum)
?E-Visit for Sore Throat ? ?Thank you for answering my additional questions. I agree with the urgent care provider who thinks you have a viral infection. The prednisone likely helped because it's a good anti-inflammatory medicine; you can take ibuprofen instead for pain. If you are still sick next week, you'll need to be seen in-person again for further help. I can prescribe viscous lidocaine, a numbing medicine, to help you with your throat pain. Keep in mind it will help numb your throat but it will also numb your mouth. Gargling with salt water and throat lozenges are also helpful.  ? ?We are sorry that you are not feeling well.  Here is how we plan to help! ? ?Your symptoms indicate a likely viral infection (Pharyngitis).   Pharyngitis is inflammation in the back of the throat which can cause a sore throat, scratchiness and sometimes difficulty swallowing.   Pharyngitis is typically caused by a respiratory virus and will just run its course.  Please keep in mind that your symptoms could last up to 10 -14 days.  For throat pain, we recommend over the counter oral pain relief medications such as acetaminophen or aspirin, or anti-inflammatory medications such as ibuprofen or naproxen sodium.  Topical treatments such as oral throat lozenges or sprays may be used as needed.  Avoid close contact with loved ones, especially the very young and elderly.  Remember to wash your hands thoroughly throughout the day as this is the number one way to prevent the spread of infection and wipe down door knobs and counters with disinfectant. ? ?After careful review of your answers, I would not recommend an antibiotic for your condition.  Antibiotics should not be used to treat conditions that we suspect are caused by viruses like the virus that causes the common cold or flu.  ? ?Providers prescribe antibiotics to treat infections caused by bacteria. Antibiotics are very powerful in treating bacterial infections when they are used  properly.  To maintain their effectiveness, they should be used only when necessary.  Overuse of antibiotics has resulted in the development of super bugs that are resistant to treatment!   ? ?Home Care: ?Only take medications as instructed by your medical team. ?Do not drink alcohol while taking these medications. ?A steam or ultrasonic humidifier can help congestion.  You can place a towel over your head and breathe in the steam from hot water coming from a faucet. ?Avoid close contacts especially the very young and the elderly. ?Cover your mouth when you cough or sneeze. ?Always remember to wash your hands. ? ?Get Help Right Away If: ?You develop worsening fever or throat pain. ?You develop a severe head ache or visual changes. ?Your symptoms persist after you have completed your treatment plan. ? ?Make sure you ?Understand these instructions. ?Will watch your condition. ?Will get help right away if you are not doing well or get worse. ? ? ?Thank you for choosing an e-visit. ? ?Your e-visit answers were reviewed by a board certified advanced clinical practitioner to complete your personal care plan. Depending upon the condition, your plan could have included both over the counter or prescription medications. ? ?Please review your pharmacy choice. Make sure the pharmacy is open so you can pick up prescription now. If there is a problem, you may contact your provider through [employer] and have the prescription routed to another pharmacy.  Your safety is important to [place]. If you have drug allergies check your prescription carefully.  ? ?For the  next 24 hours you can use MyChart to ask questions about today's visit, request a non-urgent call back, or ask for a work or school excuse. ?You will get an email in the next two days asking about your experience. I hope that your e-visit has been valuable and will speed your recovery. ? ?I have spent 5 minutes in review of e-visit questionnaire, review and updating  patient chart, medical decision making and response to patient.  ? ?Willeen Cass, PhD, FNP-BC ?  ?

## 2021-04-01 NOTE — Addendum Note (Signed)
Addended by: Carvel Getting on: 04/01/2021 02:43 PM ? ? Modules accepted: Orders ? ?

## 2021-04-04 DIAGNOSIS — H1031 Unspecified acute conjunctivitis, right eye: Secondary | ICD-10-CM | POA: Diagnosis not present

## 2021-04-22 ENCOUNTER — Emergency Department
Admission: EM | Admit: 2021-04-22 | Discharge: 2021-04-22 | Disposition: A | Discharge status: Home / Self Care | Payer: BC Managed Care – PPO | Source: Home / Self Care | Attending: Family Medicine | Admitting: Family Medicine

## 2021-04-22 ENCOUNTER — Other Ambulatory Visit: Payer: Self-pay

## 2021-04-22 ENCOUNTER — Encounter: Payer: Self-pay | Admitting: Emergency Medicine

## 2021-04-22 DIAGNOSIS — B999 Unspecified infectious disease: Secondary | ICD-10-CM

## 2021-04-22 DIAGNOSIS — J069 Acute upper respiratory infection, unspecified: Secondary | ICD-10-CM

## 2021-04-22 MED ORDER — AZITHROMYCIN 250 MG PO TABS: ORAL_TABLET | ORAL | 0 refills | Status: DC

## 2021-04-22 MED ORDER — PREDNISONE 20 MG PO TABS: 20.0000 mg | ORAL_TABLET | Freq: Two times a day (BID) | ORAL | 0 refills | Status: DC

## 2021-04-22 MED ORDER — BENZONATATE 200 MG PO CAPS: 200.0000 mg | ORAL_CAPSULE | Freq: Two times a day (BID) | ORAL | 0 refills | Status: DC | PRN

## 2021-04-22 NOTE — ED Provider Notes (Signed)
?[facility] ? ? ? ?CSN: [id number] ?Arrival date & time: 04/22/21  1518 ? ? ?  ? ?History   ?Chief Complaint ?Chief Complaint  ?Patient presents with  ? Cough  ? ? ?HPI ?Jill Lloyd is a 41 y.o. female.  ? ?HPI ? ?Patient states that she had COVID in February.  After that she had an acute bacterial sinus infection.  She took antibiotics.  It took 10 days to 2 weeks to get over this.  After this infection she had a "bad virus".  This also took some time to recover from.  She felt well for short period of time and then had pinkeye.  She states that she now has another viral infection with cough.  She feels like she has been sick ever since she had COVID.  She is worried about long COVID.  She states she is also had some GI symptoms and a general feeling of hunger even though she has recently eaten, stomach discomfort.  No pain no nausea no vomiting no diarrhea. ? ?Past Medical History:  ?Diagnosis Date  ? Depression   ? Frequent UTI   ? ? ?Patient Active Problem List  ? Diagnosis Date Noted  ? Gross hematuria 06/19/2020  ? Acute cystitis with hematuria 06/19/2020  ? Gastroesophageal reflux disease 03/19/2020  ? Chronic cystitis 03/21/2019  ? Lipoma of right forearm 12/28/2018  ? Family history of breast cancer 12/15/2018  ? Numbness and tingling in right hand 12/15/2018  ? Class 2 obesity due to excess calories without serious comorbidity with body mass index (BMI) of 38.0 to 38.9 in adult 12/13/2018  ? Frequent UTI 12/13/2018  ? Arm mass, right 12/13/2018  ? Dysthymia 12/13/2018  ? ? ?Past Surgical History:  ?Procedure Laterality Date  ? ABLATION    ? CESAREAN SECTION    ? ? ?OB History   ?No obstetric history on file. ?  ? ? ? ?Home Medications   ? ?Prior to Admission medications   ?Medication Sig Start Date End Date Taking? Authorizing Provider  ?azithromycin (ZITHROMAX Z-PAK) 250 MG tablet Take two pills today followed by one a day until gone 04/22/21  Yes Raylene Everts, MD  ?benzonatate  (TESSALON) 200 MG capsule Take 1 capsule (200 mg total) by mouth 2 (two) times daily as needed for cough. 04/22/21  Yes Raylene Everts, MD  ?predniSONE (DELTASONE) 20 MG tablet Take 1 tablet (20 mg total) by mouth 2 (two) times daily with a meal. 04/22/21  Yes Raylene Everts, MD  ?CRANBERRY PO Take by mouth daily.    [provider]  ?lidocaine (XYLOCAINE) 2 % solution Use as directed 15 mLs in the mouth or throat as needed for mouth pain. 04/01/21   Carvel Getting, NP  ?omeprazole (PRILOSEC) 40 MG capsule TAKE 1 CAPSULE BY MOUTH EVERY DAY. 03/25/21   Donella Stade, PA-C  ?WEGOVY 0.25 MG/0.5ML SOAJ Inject 0.25 mg into the skin once a week. Use this dose for 1 month (4 shots) and then increase to next higher dose. 03/25/21   Donella Stade, PA-C  ? ? ?Family History ?Family History  ?Problem Relation Age of Onset  ? Breast cancer Mother   ? Breast cancer Maternal Aunt   ? Breast cancer Maternal Grandmother   ? Colon cancer Other   ? Skin cancer Other   ? ? ?Social History ?Social History  ? ?Tobacco Use  ? Smoking status: Former  ?  Types: Cigarettes  ?  Quit date: 01/13/2009  ?  Years since quitting: 12.2  ? Smokeless tobacco: Never  ?Substance Use Topics  ? Alcohol use: Yes  ?  Comment: rarely  ? Drug use: Never  ? ? ? ?Allergies   ?Phentermine hcl ? ? ?Review of Systems ?Review of Systems ?See HPI ? ?Physical Exam ?Triage Vital Signs ?ED Triage Vitals  ?Enc Vitals Group  ?   BP 04/22/21 1553 (!) 141/101-see repeat blood pressure  ?   Pulse Rate 04/22/21 1553 96  ?   Resp 04/22/21 1553 16  ?   Temp 04/22/21 1553 98.2 ?F (36.8 ?C)  ?   Temp Source 04/22/21 1553 Oral  ?   SpO2 04/22/21 1553 96 %  ?   Weight --   ?   Height --   ?   Head Circumference --   ?   Peak Flow --   ?   Pain Score 04/22/21 1555 0  ?   Pain Loc --   ?   Pain Edu? --   ?   Excl. in [place]? --   ? ?No data found. ? ?Updated Vital Signs ?BP 129/86 (BP Location: Right Arm)   Pulse 96   Temp 98.2 ?F (36.8 ?C) (Oral)   Resp 16   SpO2  96%  ? ?   ? ?Physical Exam ?Constitutional:   ?   General: She is not in acute distress. ?   Appearance: She is well-developed.  ?   Comments: Pleasant.  Overweight  ?HENT:  ?   Head: Normocephalic and atraumatic.  ?   Right Ear: Tympanic membrane and ear canal normal.  ?   Left Ear: Tympanic membrane and ear canal normal.  ?   Nose: Nose normal. No congestion or rhinorrhea.  ?   Mouth/Throat:  ?   Pharynx: Posterior oropharyngeal erythema present.  ?Eyes:  ?   Conjunctiva/sclera: Conjunctivae normal.  ?   Pupils: Pupils are equal, round, and reactive to light.  ?Cardiovascular:  ?   Rate and Rhythm: Normal rate and regular rhythm.  ?   Heart sounds: Normal heart sounds.  ?Pulmonary:  ?   Effort: Pulmonary effort is normal. No respiratory distress.  ?   Breath sounds: Rhonchi present.  ?Abdominal:  ?   General: There is no distension.  ?   Palpations: Abdomen is soft.  ?Musculoskeletal:     ?   General: Normal range of motion.  ?   Cervical back: Normal range of motion and neck supple.  ?Lymphadenopathy:  ?   Cervical: No cervical adenopathy.  ?Skin: ?   General: Skin is warm and dry.  ?Neurological:  ?   Mental Status: She is alert.  ?Psychiatric:     ?   Mood and Affect: Mood normal.     ?   Behavior: Behavior normal.  ? ? ? ?UC Treatments / Results  ?Labs ?(all labs ordered are listed, but only abnormal results are displayed) ?Labs Reviewed - No data to display ? ?EKG ? ? ?Radiology ?No results found. ? ?Procedures ?Procedures (including critical care time) ? ?Medications Ordered in UC ?Medications - No data to display ? ?Initial Impression / Assessment and Plan / UC Course  ?I have reviewed the triage vital signs and the nursing notes. ? ?Pertinent labs & imaging results that were available during my care of the patient were reviewed by me and considered in my medical decision making (see chart for details). ? ?  ? ?I explained  to patient that I feel like she has a viral infection.  She may be having several  infections in a row because she has failed to regain her usual immunity in between her diagnoses.  Do not suspect long COVID at this point, especially since she has different symptoms with each infection.  Follow-up with PCP ?Final Clinical Impressions(s) / UC Diagnoses  ? ?Final diagnoses:  ?Viral URI with cough  ?Recurrent infections  ? ? ? ?Discharge Instructions   ? ?  ?Make sure that you are drinking lots of fluids ?Run a humidifier in your bedroom if you have 1 available ?Take the prednisone 2 times a day for 5 days ?This will help with the cough and wheezing ?I have prescribed a cough pill to take if needed for coughing ?Try to get plenty of rest ?I have given you a written prescription of azithromycin to fill and take if you fail to improve over the next few days ? ? ?ED Prescriptions   ? ? Medication Sig Dispense Auth. Provider  ? predniSONE (DELTASONE) 20 MG tablet Take 1 tablet (20 mg total) by mouth 2 (two) times daily with a meal. 10 tablet Raylene Everts, MD  ? benzonatate (TESSALON) 200 MG capsule Take 1 capsule (200 mg total) by mouth 2 (two) times daily as needed for cough. 20 capsule Raylene Everts, MD  ? azithromycin (ZITHROMAX Z-PAK) 250 MG tablet Take two pills today followed by one a day until gone 6 tablet Meda Coffee Jennette Banker, MD  ? ?  ? ?PDMP not reviewed this encounter. ?  ?Raylene Everts, MD ?04/22/21 1648 ? ?

## 2021-04-22 NOTE — Discharge Instructions (Signed)
Make sure that you are drinking lots of fluids ?Run a humidifier in your bedroom if you have 1 available ?Take the prednisone 2 times a day for 5 days ?This will help with the cough and wheezing ?I have prescribed a cough pill to take if needed for coughing ?Try to get plenty of rest ?I have given you a written prescription of azithromycin to fill and take if you fail to improve over the next few days ?

## 2021-04-22 NOTE — ED Triage Notes (Addendum)
Pt states she has been coughing since Saturday. She is also having some fatigue but denies other symptoms. She has not used any OTC meds. She did have covid in feb ?

## 2021-05-10 IMAGING — US US EXTREM UP *R* LTD
1 series · 10 of 10 positions shown · non-contrast
Comparison: None.

CLINICAL DATA: Firm palpable mass in the right forearm.

EXAM:
ULTRASOUND right UPPER EXTREMITY LIMITED
TECHNIQUE: Ultrasound examination of the upper extremity soft tissues was
performed in the area of clinical concern.

[Series 1: us extrem up *right* ltd · 0.04mm/px · 10 acquisitions, 10 frames shown]
[im 1/10]
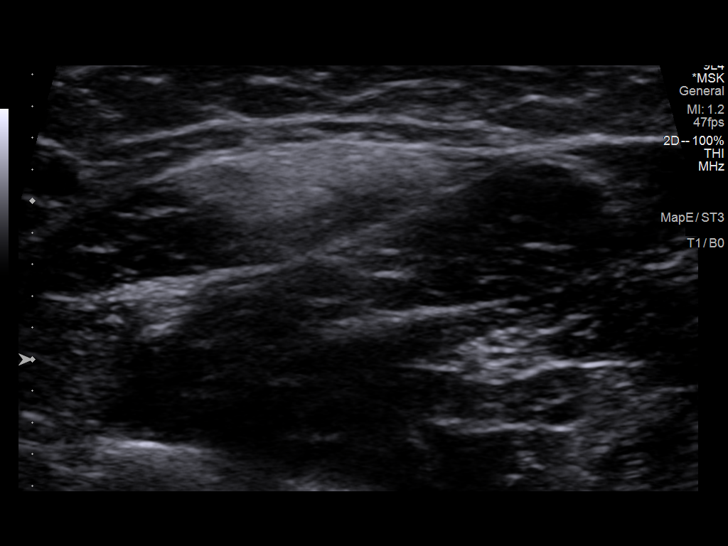
[im 2/10]
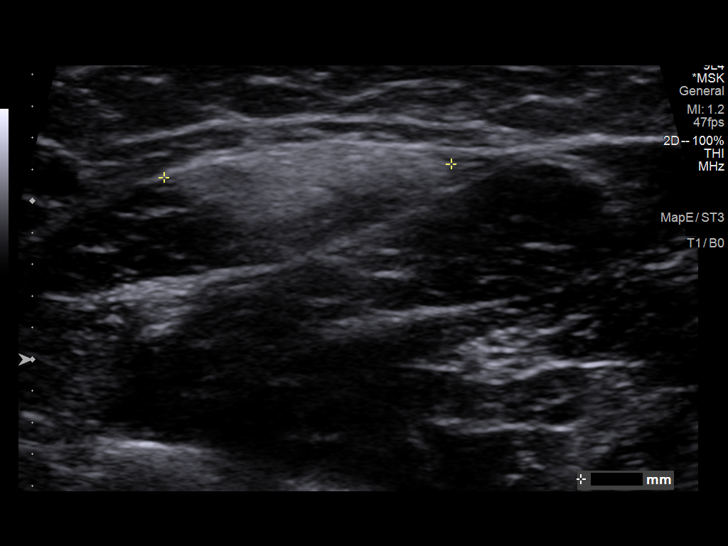
[im 3/10]
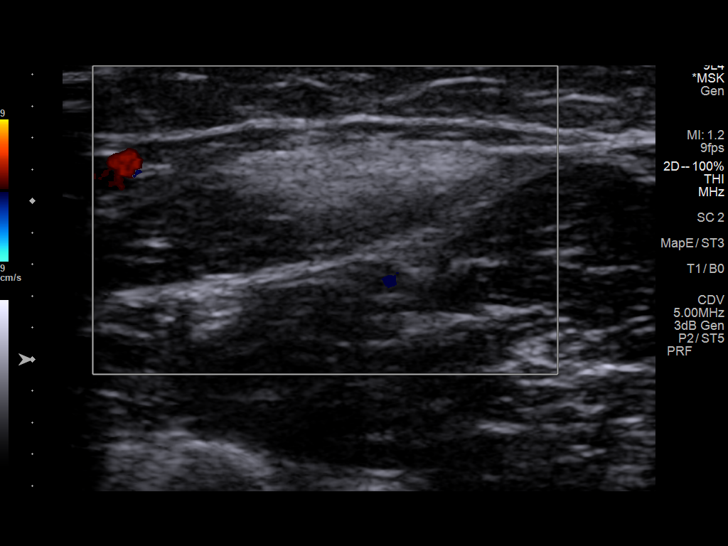
[im 4/10]
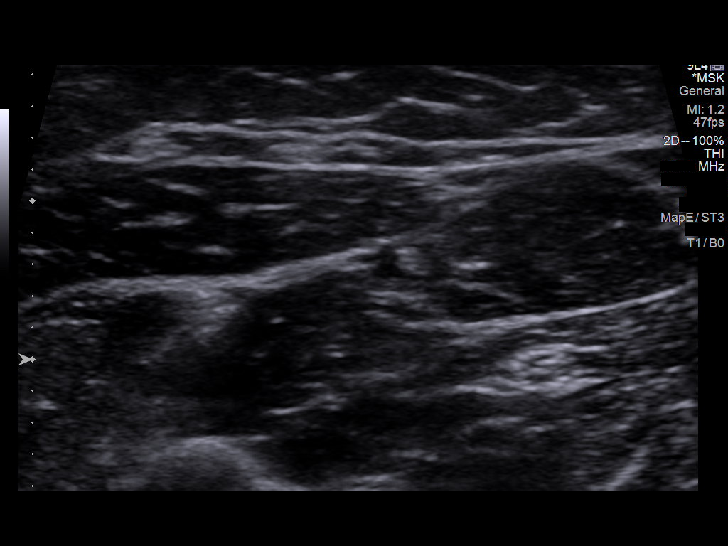
[im 5/10]
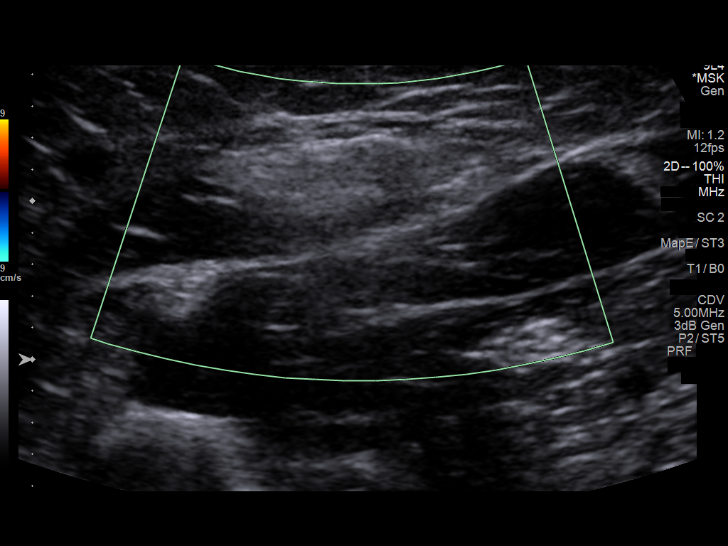
[im 6/10]
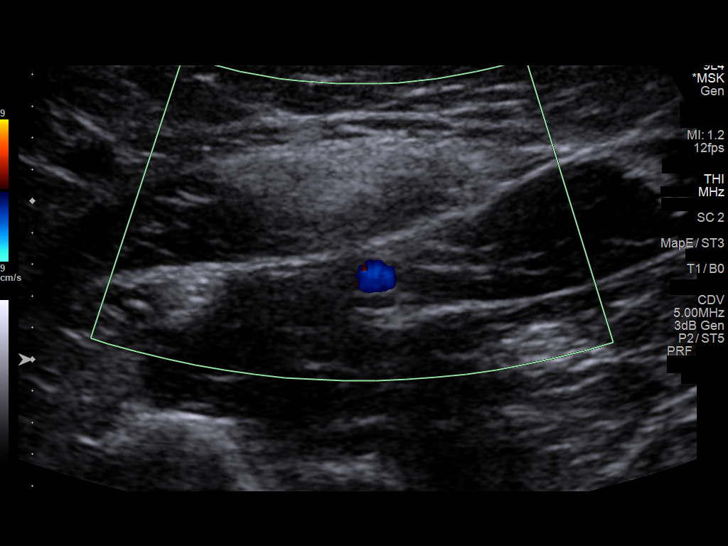
[im 7/10]
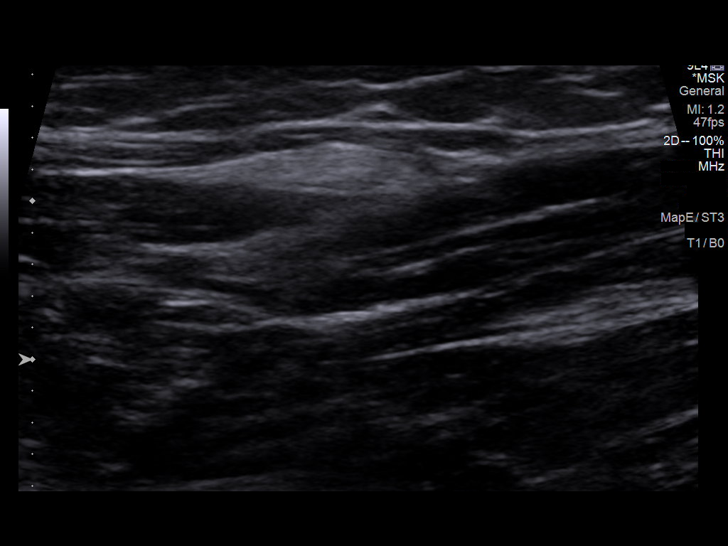
[im 8/10]
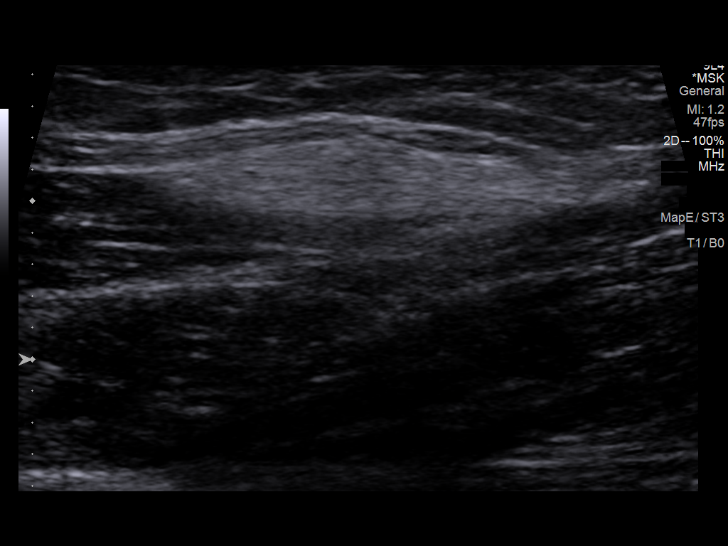
[im 9/10]
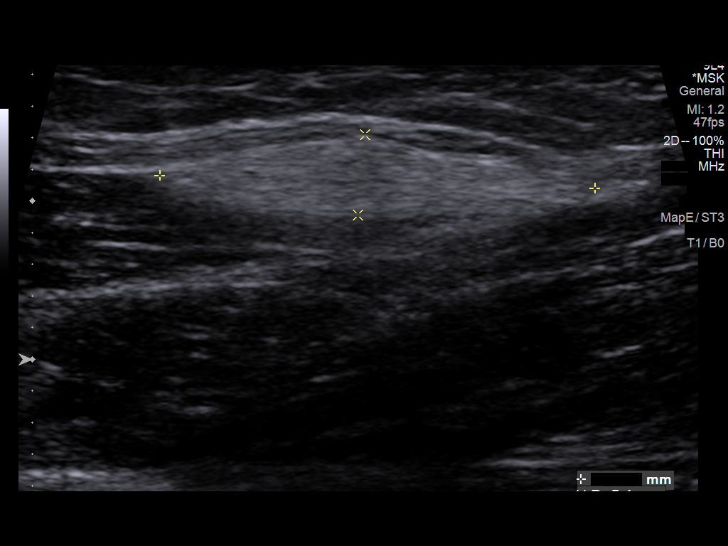
[im 10/10]
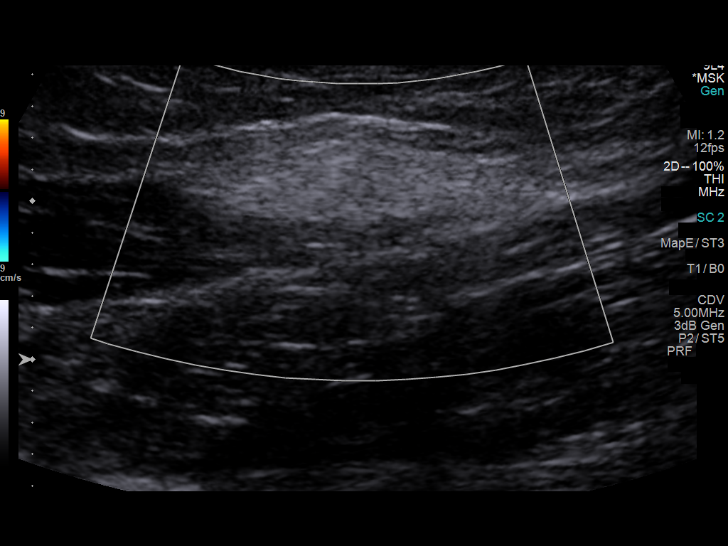

[10 of 10 positions shown; findings below may reference images not displayed]

FINDINGS: The patient's palpable abnormality corresponds to an elongated
x 5.0 mm echogenic lesion. It has a a tail like configuration and
could be a neurogenic tumor such as schwannoma or neurofibroma. It
is much more echogenic than the subcutaneous fat and unlikely a
lipoma. Recommend MRI imaging without and with contrast for further
evaluation.
IMPRESSION: Elongated 27.5 x 5.0 mm echogenic forearm lesion possibly a
neurogenic tumor. Recommend MRI imaging without and with contrast
for further evaluation.

## 2021-09-09 DIAGNOSIS — Z6839 Body mass index (BMI) 39.0-39.9, adult: Secondary | ICD-10-CM | POA: Diagnosis not present

## 2021-09-09 DIAGNOSIS — Z124 Encounter for screening for malignant neoplasm of cervix: Secondary | ICD-10-CM | POA: Diagnosis not present

## 2021-09-09 DIAGNOSIS — Z1151 Encounter for screening for human papillomavirus (HPV): Secondary | ICD-10-CM | POA: Diagnosis not present

## 2021-09-09 DIAGNOSIS — Z01419 Encounter for gynecological examination (general) (routine) without abnormal findings: Secondary | ICD-10-CM | POA: Diagnosis not present

## 2021-09-09 DIAGNOSIS — Z803 Family history of malignant neoplasm of breast: Secondary | ICD-10-CM | POA: Diagnosis not present

## 2021-09-14 LAB — HM PAP SMEAR: HM Pap smear: NEGATIVE

## 2021-10-18 DIAGNOSIS — Z1231 Encounter for screening mammogram for malignant neoplasm of breast: Secondary | ICD-10-CM | POA: Diagnosis not present

## 2021-10-22 LAB — HM MAMMOGRAPHY

## 2022-02-03 DIAGNOSIS — M25531 Pain in right wrist: Secondary | ICD-10-CM | POA: Diagnosis not present

## 2022-02-03 DIAGNOSIS — M4723 Other spondylosis with radiculopathy, cervicothoracic region: Secondary | ICD-10-CM | POA: Diagnosis not present

## 2022-02-03 DIAGNOSIS — M4728 Other spondylosis with radiculopathy, sacral and sacrococcygeal region: Secondary | ICD-10-CM | POA: Diagnosis not present

## 2022-02-03 DIAGNOSIS — M4726 Other spondylosis with radiculopathy, lumbar region: Secondary | ICD-10-CM | POA: Diagnosis not present

## 2022-02-04 DIAGNOSIS — M4728 Other spondylosis with radiculopathy, sacral and sacrococcygeal region: Secondary | ICD-10-CM | POA: Diagnosis not present

## 2022-02-04 DIAGNOSIS — M4726 Other spondylosis with radiculopathy, lumbar region: Secondary | ICD-10-CM | POA: Diagnosis not present

## 2022-02-04 DIAGNOSIS — M4723 Other spondylosis with radiculopathy, cervicothoracic region: Secondary | ICD-10-CM | POA: Diagnosis not present

## 2022-02-04 DIAGNOSIS — M25531 Pain in right wrist: Secondary | ICD-10-CM | POA: Diagnosis not present

## 2022-02-06 DIAGNOSIS — M4728 Other spondylosis with radiculopathy, sacral and sacrococcygeal region: Secondary | ICD-10-CM | POA: Diagnosis not present

## 2022-02-06 DIAGNOSIS — M25531 Pain in right wrist: Secondary | ICD-10-CM | POA: Diagnosis not present

## 2022-02-06 DIAGNOSIS — M4723 Other spondylosis with radiculopathy, cervicothoracic region: Secondary | ICD-10-CM | POA: Diagnosis not present

## 2022-02-06 DIAGNOSIS — M4726 Other spondylosis with radiculopathy, lumbar region: Secondary | ICD-10-CM | POA: Diagnosis not present

## 2022-02-10 DIAGNOSIS — M4728 Other spondylosis with radiculopathy, sacral and sacrococcygeal region: Secondary | ICD-10-CM | POA: Diagnosis not present

## 2022-02-10 DIAGNOSIS — M4723 Other spondylosis with radiculopathy, cervicothoracic region: Secondary | ICD-10-CM | POA: Diagnosis not present

## 2022-02-10 DIAGNOSIS — M4726 Other spondylosis with radiculopathy, lumbar region: Secondary | ICD-10-CM | POA: Diagnosis not present

## 2022-02-10 DIAGNOSIS — M25531 Pain in right wrist: Secondary | ICD-10-CM | POA: Diagnosis not present

## 2022-02-11 DIAGNOSIS — M4726 Other spondylosis with radiculopathy, lumbar region: Secondary | ICD-10-CM | POA: Diagnosis not present

## 2022-02-11 DIAGNOSIS — M4728 Other spondylosis with radiculopathy, sacral and sacrococcygeal region: Secondary | ICD-10-CM | POA: Diagnosis not present

## 2022-02-11 DIAGNOSIS — M4723 Other spondylosis with radiculopathy, cervicothoracic region: Secondary | ICD-10-CM | POA: Diagnosis not present

## 2022-02-11 DIAGNOSIS — M25531 Pain in right wrist: Secondary | ICD-10-CM | POA: Diagnosis not present

## 2022-02-13 DIAGNOSIS — M4723 Other spondylosis with radiculopathy, cervicothoracic region: Secondary | ICD-10-CM | POA: Diagnosis not present

## 2022-02-13 DIAGNOSIS — M25531 Pain in right wrist: Secondary | ICD-10-CM | POA: Diagnosis not present

## 2022-02-13 DIAGNOSIS — M4728 Other spondylosis with radiculopathy, sacral and sacrococcygeal region: Secondary | ICD-10-CM | POA: Diagnosis not present

## 2022-02-13 DIAGNOSIS — M4726 Other spondylosis with radiculopathy, lumbar region: Secondary | ICD-10-CM | POA: Diagnosis not present

## 2022-02-17 DIAGNOSIS — M4723 Other spondylosis with radiculopathy, cervicothoracic region: Secondary | ICD-10-CM | POA: Diagnosis not present

## 2022-02-17 DIAGNOSIS — M4726 Other spondylosis with radiculopathy, lumbar region: Secondary | ICD-10-CM | POA: Diagnosis not present

## 2022-02-17 DIAGNOSIS — M25531 Pain in right wrist: Secondary | ICD-10-CM | POA: Diagnosis not present

## 2022-02-17 DIAGNOSIS — M4728 Other spondylosis with radiculopathy, sacral and sacrococcygeal region: Secondary | ICD-10-CM | POA: Diagnosis not present

## 2022-02-18 DIAGNOSIS — M4723 Other spondylosis with radiculopathy, cervicothoracic region: Secondary | ICD-10-CM | POA: Diagnosis not present

## 2022-02-18 DIAGNOSIS — M25531 Pain in right wrist: Secondary | ICD-10-CM | POA: Diagnosis not present

## 2022-02-18 DIAGNOSIS — M4728 Other spondylosis with radiculopathy, sacral and sacrococcygeal region: Secondary | ICD-10-CM | POA: Diagnosis not present

## 2022-02-18 DIAGNOSIS — M4726 Other spondylosis with radiculopathy, lumbar region: Secondary | ICD-10-CM | POA: Diagnosis not present

## 2022-02-19 DIAGNOSIS — M4728 Other spondylosis with radiculopathy, sacral and sacrococcygeal region: Secondary | ICD-10-CM | POA: Diagnosis not present

## 2022-02-19 DIAGNOSIS — M25531 Pain in right wrist: Secondary | ICD-10-CM | POA: Diagnosis not present

## 2022-02-19 DIAGNOSIS — M4723 Other spondylosis with radiculopathy, cervicothoracic region: Secondary | ICD-10-CM | POA: Diagnosis not present

## 2022-02-19 DIAGNOSIS — M4726 Other spondylosis with radiculopathy, lumbar region: Secondary | ICD-10-CM | POA: Diagnosis not present

## 2022-02-24 DIAGNOSIS — M4728 Other spondylosis with radiculopathy, sacral and sacrococcygeal region: Secondary | ICD-10-CM | POA: Diagnosis not present

## 2022-02-24 DIAGNOSIS — M4726 Other spondylosis with radiculopathy, lumbar region: Secondary | ICD-10-CM | POA: Diagnosis not present

## 2022-02-24 DIAGNOSIS — M4723 Other spondylosis with radiculopathy, cervicothoracic region: Secondary | ICD-10-CM | POA: Diagnosis not present

## 2022-02-24 DIAGNOSIS — M25531 Pain in right wrist: Secondary | ICD-10-CM | POA: Diagnosis not present

## 2022-02-26 DIAGNOSIS — M4726 Other spondylosis with radiculopathy, lumbar region: Secondary | ICD-10-CM | POA: Diagnosis not present

## 2022-02-26 DIAGNOSIS — M25531 Pain in right wrist: Secondary | ICD-10-CM | POA: Diagnosis not present

## 2022-02-26 DIAGNOSIS — M4723 Other spondylosis with radiculopathy, cervicothoracic region: Secondary | ICD-10-CM | POA: Diagnosis not present

## 2022-02-26 DIAGNOSIS — M4728 Other spondylosis with radiculopathy, sacral and sacrococcygeal region: Secondary | ICD-10-CM | POA: Diagnosis not present

## 2022-02-27 DIAGNOSIS — M4726 Other spondylosis with radiculopathy, lumbar region: Secondary | ICD-10-CM | POA: Diagnosis not present

## 2022-02-27 DIAGNOSIS — M25531 Pain in right wrist: Secondary | ICD-10-CM | POA: Diagnosis not present

## 2022-02-27 DIAGNOSIS — M4723 Other spondylosis with radiculopathy, cervicothoracic region: Secondary | ICD-10-CM | POA: Diagnosis not present

## 2022-02-27 DIAGNOSIS — M4728 Other spondylosis with radiculopathy, sacral and sacrococcygeal region: Secondary | ICD-10-CM | POA: Diagnosis not present

## 2022-03-04 DIAGNOSIS — M4723 Other spondylosis with radiculopathy, cervicothoracic region: Secondary | ICD-10-CM | POA: Diagnosis not present

## 2022-03-04 DIAGNOSIS — M4726 Other spondylosis with radiculopathy, lumbar region: Secondary | ICD-10-CM | POA: Diagnosis not present

## 2022-03-04 DIAGNOSIS — M25531 Pain in right wrist: Secondary | ICD-10-CM | POA: Diagnosis not present

## 2022-03-04 DIAGNOSIS — M4728 Other spondylosis with radiculopathy, sacral and sacrococcygeal region: Secondary | ICD-10-CM | POA: Diagnosis not present

## 2022-03-06 DIAGNOSIS — M4728 Other spondylosis with radiculopathy, sacral and sacrococcygeal region: Secondary | ICD-10-CM | POA: Diagnosis not present

## 2022-03-06 DIAGNOSIS — M4726 Other spondylosis with radiculopathy, lumbar region: Secondary | ICD-10-CM | POA: Diagnosis not present

## 2022-03-06 DIAGNOSIS — M4723 Other spondylosis with radiculopathy, cervicothoracic region: Secondary | ICD-10-CM | POA: Diagnosis not present

## 2022-03-06 DIAGNOSIS — M25531 Pain in right wrist: Secondary | ICD-10-CM | POA: Diagnosis not present

## 2022-03-10 DIAGNOSIS — M4723 Other spondylosis with radiculopathy, cervicothoracic region: Secondary | ICD-10-CM | POA: Diagnosis not present

## 2022-03-10 DIAGNOSIS — M4726 Other spondylosis with radiculopathy, lumbar region: Secondary | ICD-10-CM | POA: Diagnosis not present

## 2022-03-10 DIAGNOSIS — M25531 Pain in right wrist: Secondary | ICD-10-CM | POA: Diagnosis not present

## 2022-03-10 DIAGNOSIS — M4728 Other spondylosis with radiculopathy, sacral and sacrococcygeal region: Secondary | ICD-10-CM | POA: Diagnosis not present

## 2022-03-12 DIAGNOSIS — M4726 Other spondylosis with radiculopathy, lumbar region: Secondary | ICD-10-CM | POA: Diagnosis not present

## 2022-03-12 DIAGNOSIS — M4723 Other spondylosis with radiculopathy, cervicothoracic region: Secondary | ICD-10-CM | POA: Diagnosis not present

## 2022-03-12 DIAGNOSIS — M4728 Other spondylosis with radiculopathy, sacral and sacrococcygeal region: Secondary | ICD-10-CM | POA: Diagnosis not present

## 2022-03-12 DIAGNOSIS — M25531 Pain in right wrist: Secondary | ICD-10-CM | POA: Diagnosis not present

## 2022-03-13 DIAGNOSIS — M4723 Other spondylosis with radiculopathy, cervicothoracic region: Secondary | ICD-10-CM | POA: Diagnosis not present

## 2022-03-13 DIAGNOSIS — M25531 Pain in right wrist: Secondary | ICD-10-CM | POA: Diagnosis not present

## 2022-03-13 DIAGNOSIS — M4726 Other spondylosis with radiculopathy, lumbar region: Secondary | ICD-10-CM | POA: Diagnosis not present

## 2022-03-13 DIAGNOSIS — M4728 Other spondylosis with radiculopathy, sacral and sacrococcygeal region: Secondary | ICD-10-CM | POA: Diagnosis not present

## 2022-03-18 DIAGNOSIS — M4723 Other spondylosis with radiculopathy, cervicothoracic region: Secondary | ICD-10-CM | POA: Diagnosis not present

## 2022-03-18 DIAGNOSIS — M4728 Other spondylosis with radiculopathy, sacral and sacrococcygeal region: Secondary | ICD-10-CM | POA: Diagnosis not present

## 2022-03-18 DIAGNOSIS — M25531 Pain in right wrist: Secondary | ICD-10-CM | POA: Diagnosis not present

## 2022-03-18 DIAGNOSIS — M4726 Other spondylosis with radiculopathy, lumbar region: Secondary | ICD-10-CM | POA: Diagnosis not present

## 2022-04-07 DIAGNOSIS — M4728 Other spondylosis with radiculopathy, sacral and sacrococcygeal region: Secondary | ICD-10-CM | POA: Diagnosis not present

## 2022-04-07 DIAGNOSIS — M4723 Other spondylosis with radiculopathy, cervicothoracic region: Secondary | ICD-10-CM | POA: Diagnosis not present

## 2022-04-07 DIAGNOSIS — M25531 Pain in right wrist: Secondary | ICD-10-CM | POA: Diagnosis not present

## 2022-04-07 DIAGNOSIS — M4726 Other spondylosis with radiculopathy, lumbar region: Secondary | ICD-10-CM | POA: Diagnosis not present

## 2022-05-01 ENCOUNTER — Other Ambulatory Visit: Payer: Self-pay | Admitting: Physician Assistant

## 2022-05-01 DIAGNOSIS — K219 Gastro-esophageal reflux disease without esophagitis: Secondary | ICD-10-CM

## 2022-06-02 ENCOUNTER — Ambulatory Visit (INDEPENDENT_AMBULATORY_CARE_PROVIDER_SITE_OTHER): Payer: BC Managed Care – PPO | Admitting: Physician Assistant

## 2022-06-02 ENCOUNTER — Encounter: Payer: Self-pay | Admitting: Physician Assistant

## 2022-06-02 VITALS — BP 137/78 | HR 97 | Ht 64.0 in | Wt 234.0 lb

## 2022-06-02 DIAGNOSIS — Z1329 Encounter for screening for other suspected endocrine disorder: Secondary | ICD-10-CM

## 2022-06-02 DIAGNOSIS — Z23 Encounter for immunization: Secondary | ICD-10-CM

## 2022-06-02 DIAGNOSIS — M4723 Other spondylosis with radiculopathy, cervicothoracic region: Secondary | ICD-10-CM | POA: Diagnosis not present

## 2022-06-02 DIAGNOSIS — M4728 Other spondylosis with radiculopathy, sacral and sacrococcygeal region: Secondary | ICD-10-CM | POA: Diagnosis not present

## 2022-06-02 DIAGNOSIS — E66813 Obesity, class 3: Secondary | ICD-10-CM

## 2022-06-02 DIAGNOSIS — E6609 Other obesity due to excess calories: Secondary | ICD-10-CM

## 2022-06-02 DIAGNOSIS — R5383 Other fatigue: Secondary | ICD-10-CM

## 2022-06-02 DIAGNOSIS — E559 Vitamin D deficiency, unspecified: Secondary | ICD-10-CM

## 2022-06-02 DIAGNOSIS — R300 Dysuria: Secondary | ICD-10-CM

## 2022-06-02 DIAGNOSIS — K219 Gastro-esophageal reflux disease without esophagitis: Secondary | ICD-10-CM

## 2022-06-02 DIAGNOSIS — R748 Abnormal levels of other serum enzymes: Secondary | ICD-10-CM | POA: Diagnosis not present

## 2022-06-02 DIAGNOSIS — M25531 Pain in right wrist: Secondary | ICD-10-CM | POA: Diagnosis not present

## 2022-06-02 DIAGNOSIS — Z13220 Encounter for screening for lipoid disorders: Secondary | ICD-10-CM

## 2022-06-02 DIAGNOSIS — M4726 Other spondylosis with radiculopathy, lumbar region: Secondary | ICD-10-CM | POA: Diagnosis not present

## 2022-06-02 DIAGNOSIS — Z131 Encounter for screening for diabetes mellitus: Secondary | ICD-10-CM

## 2022-06-02 DIAGNOSIS — Z0000 Encounter for general adult medical examination without abnormal findings: Secondary | ICD-10-CM

## 2022-06-02 MED ORDER — WEGOVY 0.25 MG/0.5ML ~~LOC~~ SOAJ: 0.2500 mg | SUBCUTANEOUS | 0 refills | Status: DC

## 2022-06-02 MED ORDER — OMEPRAZOLE 40 MG PO CPDR: DELAYED_RELEASE_CAPSULE | ORAL | 3 refills | Status: DC

## 2022-06-02 MED ORDER — PHENAZOPYRIDINE HCL 200 MG PO TABS: 200.0000 mg | ORAL_TABLET | Freq: Three times a day (TID) | ORAL | 0 refills | Status: AC

## 2022-06-02 NOTE — Patient Instructions (Signed)

## 2022-06-02 NOTE — Progress Notes (Unsigned)
Complete physical exam  Patient: Jill Lloyd   DOB: August 19, 1980   42 y.o. Female  MRN: [id number]  Subjective:    Chief Complaint  Patient presents with   Annual Exam    Jill Lloyd is a 42 y.o. female who presents today for a complete physical exam. She reports consuming a general diet. The patient does not participate in regular exercise at present. She generally feels fairly well. She reports sleeping fairly well. She does have additional problems to discuss today.   She is frustrated with weight and how tired she feels all the time. She feels like she sleeps well and does not snore. She wakes up feeling rested but then quickly gets very tired throughout the day.   Most recent fall risk assessment:    03/25/2021   10:32 AM  Fall Risk   Falls in the past year? 0  Number falls in past yr: 0  Injury with Fall? 0  Risk for fall due to : No Fall Risks  Follow up Falls evaluation completed     Most recent depression screenings:    03/25/2021   10:32 AM 03/19/2020   10:11 AM  PHQ 2/9 Scores  PHQ - 2 Score 0 0  PHQ- 9 Score  0    Vision:Within last year and Dental: No current dental problems and Receives regular dental care  Patient Active Problem List   Diagnosis Date Noted   Vitamin D deficiency 06/03/2022   Dysuria 06/03/2022   Elevated liver enzymes 06/02/2022   Gross hematuria 06/19/2020   Acute cystitis with hematuria 06/19/2020   Gastroesophageal reflux disease 03/19/2020   Chronic cystitis 03/21/2019   Lipoma of right forearm 12/28/2018   Family history of breast cancer 12/15/2018   Numbness and tingling in right hand 12/15/2018   Class 2 obesity due to excess calories without serious comorbidity with body mass index (BMI) of 38.0 to 38.9 in adult 12/13/2018   Frequent UTI 12/13/2018   Arm mass, right 12/13/2018   Dysthymia 12/13/2018   Past Medical History:  Diagnosis Date   Depression    Frequent UTI    Past Surgical History:  Procedure Laterality  Date   ABLATION     CESAREAN SECTION     Family History  Problem Relation Age of Onset   Breast cancer Mother    Breast cancer Maternal Aunt    Breast cancer Maternal Grandmother    Colon cancer Other    Skin cancer Other    Allergies  Allergen Reactions   Phentermine Hcl Shortness Of Breath      Patient Care Team: Nolene Ebbs as PCP - General (Family Medicine) Speaks, Winfield Rast, MD as Referring Physician (Obstetrics)   Outpatient Medications Prior to Visit  Medication Sig   CRANBERRY PO Take by mouth daily.   [DISCONTINUED] azithromycin (ZITHROMAX Z-PAK) 250 MG tablet Take two pills today followed by one a day until gone (Patient not taking: Reported on 06/02/2022)   [DISCONTINUED] benzonatate (TESSALON) 200 MG capsule Take 1 capsule (200 mg total) by mouth 2 (two) times daily as needed for cough. (Patient not taking: Reported on 06/02/2022)   [DISCONTINUED] lidocaine (XYLOCAINE) 2 % solution Use as directed 15 mLs in the mouth or throat as needed for mouth pain.   [DISCONTINUED] omeprazole (PRILOSEC) 40 MG capsule TAKE 1 CAPSULE BY MOUTH EVERY DAY. PT IS OVERDUE FOR AN APPT.   [DISCONTINUED] predniSONE (DELTASONE) 20 MG tablet Take 1 tablet (20 mg total) by mouth  2 (two) times daily with a meal.   [DISCONTINUED] WEGOVY 0.25 MG/0.5ML SOAJ Inject 0.25 mg into the skin once a week. Use this dose for 1 month (4 shots) and then increase to next higher dose. (Patient not taking: Reported on 06/02/2022)   No facility-administered medications prior to visit.    ROS        Objective:     BP 137/78   Pulse 97   Ht 5\' 4"  (1.626 m)   Wt 234 lb (106.1 kg)   SpO2 99%   BMI 40.17 kg/m  BP Readings from Last 3 Encounters:  06/02/22 137/78  04/22/21 129/86  03/25/21 138/83   Wt Readings from Last 3 Encounters:  06/02/22 234 lb (106.1 kg)  03/25/21 226 lb (102.5 kg)  03/19/20 220 lb (99.8 kg)      Physical Exam  BP 137/78   Pulse 97   Ht 5\' 4"  (1.626 m)   Wt  234 lb (106.1 kg)   SpO2 99%   BMI 40.17 kg/m   General Appearance:    Alert, obese, cooperative, no distress, appears stated age  Head:    Normocephalic, without obvious abnormality, atraumatic  Eyes:    PERRL, conjunctiva/corneas clear, EOM's intact, fundi    benign, both eyes  Ears:    Normal TM's and external ear canals, both ears  Nose:   Nares normal, septum midline, mucosa normal, no drainage    or sinus tenderness  Throat:   Lips, mucosa, and tongue normal; teeth and gums normal  Neck:   Supple, symmetrical, trachea midline, no adenopathy;    thyroid:  no enlargement/tenderness/nodules; no carotid   bruit or JVD  Back:     Symmetric, no curvature, ROM normal, no CVA tenderness  Lungs:     Clear to auscultation bilaterally, respirations unlabored  Chest Wall:    No tenderness or deformity   Heart:    Regular rate and rhythm, S1 and S2 normal, no murmur, rub   or gallop     Abdomen:     Soft, non-tender, bowel sounds active all four quadrants,    no masses, no organomegaly        Extremities:   Extremities normal, atraumatic, no cyanosis or edema  Pulses:   2+ and symmetric all extremities  Skin:   Skin color, texture, turgor normal, no rashes or lesions  Lymph nodes:   Cervical, supraclavicular, and axillary nodes normal  Neurologic:   CNII-XII intact, normal strength, sensation and reflexes    throughout      Assessment & Plan:    Routine Health Maintenance and Physical Exam  Immunization History  Administered Date(s) Administered   Tdap 08/18/2011, 06/03/2022    Health Maintenance  Topic Date Due   INFLUENZA VACCINE  08/14/2022   MAMMOGRAM  10/30/2022   PAP SMEAR-Modifier  08/22/2026   DTaP/Tdap/Td (3 - Td or Tdap) 06/02/2032   Hepatitis C Screening  Completed   HIV Screening  Completed   HPV VACCINES  Aged Out   COVID-19 Vaccine  Discontinued    Discussed health benefits of physical activity, and encouraged her to engage in regular exercise appropriate  for her age and condition.  Marland KitchenLeotis Shames was seen today for annual exam.  Diagnoses and all orders for this visit:  Routine physical examination -     TSH -     Lipid Panel w/reflex Direct LDL -     COMPLETE METABOLIC PANEL WITH GFR -     CBC with  Differential/Platelet -     Cortisol -     FSH/LH -     Estradiol  Screening for diabetes mellitus -     COMPLETE METABOLIC PANEL WITH GFR -     CBC with Differential/Platelet  Screening for lipid disorders -     Lipid Panel w/reflex Direct LDL -     CBC with Differential/Platelet  Class 3 severe obesity due to excess calories without serious comorbidity with body mass index (BMI) of 40.0 to 44.9 in adult (HCC) -     TSH -     CBC with Differential/Platelet -     Cortisol -     WEGOVY 0.25 MG/0.5ML SOAJ; Inject 0.25 mg into the skin once a week. Use this dose for 1 month (4 shots) and then increase to next higher dose.  Elevated liver enzymes -     CBC with Differential/Platelet  No energy -     VITAMIN D 25 Hydroxy (Vit-D Deficiency, Fractures) -     B12 and Folate Panel -     Fe+TIBC+Fer -     Thyroid peroxidase antibody -     Cortisol -     FSH/LH -     Estradiol  Vitamin D deficiency -     VITAMIN D 25 Hydroxy (Vit-D Deficiency, Fractures)  Thyroid disorder screening -     Thyroid peroxidase antibody  Gastroesophageal reflux disease, unspecified whether esophagitis present -     omeprazole (PRILOSEC) 40 MG capsule; TAKE 1 CAPSULE BY MOUTH EVERY DAY.  Dysuria -     phenazopyridine (PYRIDIUM) 200 MG tablet; Take 1 tablet (200 mg total) by mouth 3 (three) times daily for 2 days. For each exacerbation.  Need for Tdap vaccination -     Tdap vaccine greater than or equal to 7yo IM   .Marland Kitchen Discussed 150 minutes of exercise a week.  Encouraged vitamin D 1000 units and Calcium 1300mg  or 4 servings of dairy a day.  Fasting labs ordered PHQ no concerns Pap and mammogram printed to be abstracted that was done at GYN Tdap  given today.  Pyridium as needed  .Marland KitchenDiscussed low carb diet with 1500 calories and 80g of protein.  Exercising at least 150 minutes a week.  My Fitness Pal could be a Chief Technology Officer.  Discussed GLP-1 vs qsymia vs wellbutrin Pt will continue to think about this       Tandy Gaw, PA-C

## 2022-06-03 ENCOUNTER — Encounter: Payer: Self-pay | Admitting: Physician Assistant

## 2022-06-03 DIAGNOSIS — R300 Dysuria: Secondary | ICD-10-CM | POA: Insufficient documentation

## 2022-06-03 DIAGNOSIS — E559 Vitamin D deficiency, unspecified: Secondary | ICD-10-CM | POA: Insufficient documentation

## 2022-06-03 DIAGNOSIS — Z23 Encounter for immunization: Secondary | ICD-10-CM | POA: Diagnosis not present

## 2022-06-03 DIAGNOSIS — Z0000 Encounter for general adult medical examination without abnormal findings: Secondary | ICD-10-CM | POA: Diagnosis not present

## 2022-06-05 ENCOUNTER — Other Ambulatory Visit: Payer: Self-pay | Admitting: Physician Assistant

## 2022-06-05 DIAGNOSIS — M4726 Other spondylosis with radiculopathy, lumbar region: Secondary | ICD-10-CM | POA: Diagnosis not present

## 2022-06-05 DIAGNOSIS — R748 Abnormal levels of other serum enzymes: Secondary | ICD-10-CM

## 2022-06-05 DIAGNOSIS — E559 Vitamin D deficiency, unspecified: Secondary | ICD-10-CM | POA: Diagnosis not present

## 2022-06-05 DIAGNOSIS — Z1329 Encounter for screening for other suspected endocrine disorder: Secondary | ICD-10-CM | POA: Diagnosis not present

## 2022-06-05 DIAGNOSIS — M4723 Other spondylosis with radiculopathy, cervicothoracic region: Secondary | ICD-10-CM | POA: Diagnosis not present

## 2022-06-05 DIAGNOSIS — Z0000 Encounter for general adult medical examination without abnormal findings: Secondary | ICD-10-CM | POA: Diagnosis not present

## 2022-06-05 DIAGNOSIS — Z6838 Body mass index (BMI) 38.0-38.9, adult: Secondary | ICD-10-CM | POA: Diagnosis not present

## 2022-06-05 DIAGNOSIS — M25531 Pain in right wrist: Secondary | ICD-10-CM | POA: Diagnosis not present

## 2022-06-05 DIAGNOSIS — M4728 Other spondylosis with radiculopathy, sacral and sacrococcygeal region: Secondary | ICD-10-CM | POA: Diagnosis not present

## 2022-06-05 DIAGNOSIS — R5383 Other fatigue: Secondary | ICD-10-CM | POA: Diagnosis not present

## 2022-06-05 DIAGNOSIS — E538 Deficiency of other specified B group vitamins: Secondary | ICD-10-CM | POA: Diagnosis not present

## 2022-06-05 DIAGNOSIS — Z131 Encounter for screening for diabetes mellitus: Secondary | ICD-10-CM | POA: Diagnosis not present

## 2022-06-05 DIAGNOSIS — Z13220 Encounter for screening for lipoid disorders: Secondary | ICD-10-CM | POA: Diagnosis not present

## 2022-06-06 ENCOUNTER — Encounter: Payer: Self-pay | Admitting: Physician Assistant

## 2022-06-06 LAB — COMPLETE METABOLIC PANEL WITH GFR
AG Ratio: 1.7 (calc) (ref 1.0–2.5)
ALT: 51 U/L — ABNORMAL HIGH (ref 6–29)
AST: 37 U/L — ABNORMAL HIGH (ref 10–30)
Albumin: 4.4 g/dL (ref 3.6–5.1)
Alkaline phosphatase (APISO): 74 U/L (ref 31–125)
BUN: 9 mg/dL (ref 7–25)
CO2: 26 mmol/L (ref 20–32)
Calcium: 8.7 mg/dL (ref 8.6–10.2)
Chloride: 105 mmol/L (ref 98–110)
Creat: 0.58 mg/dL (ref 0.50–0.99)
Globulin: 2.6 g/dL (calc) (ref 1.9–3.7)
Glucose, Bld: 111 mg/dL — ABNORMAL HIGH (ref 65–99)
Potassium: 4.5 mmol/L (ref 3.5–5.3)
Sodium: 140 mmol/L (ref 135–146)
Total Bilirubin: 0.7 mg/dL (ref 0.2–1.2)
Total Protein: 7 g/dL (ref 6.1–8.1)
eGFR: 117 mL/min/{1.73_m2} (ref >=60)

## 2022-06-06 LAB — CBC WITH DIFFERENTIAL/PLATELET
Absolute Monocytes: 593 cells/uL (ref 200–950)
Basophils Absolute: 47 cells/uL (ref 0–200)
Basophils Relative: 0.6 %
Eosinophils Absolute: 187 cells/uL (ref 15–500)
Eosinophils Relative: 2.4 %
HCT: 39 % (ref 35.0–45.0)
Hemoglobin: 12.5 g/dL (ref 11.7–15.5)
Lymphs Abs: 1997 cells/uL (ref 850–3900)
MCH: 28 pg (ref 27.0–33.0)
MCHC: 32.1 g/dL (ref 32.0–36.0)
MCV: 87.2 fL (ref 80.0–100.0)
MPV: 10.2 fL (ref 7.5–12.5)
Monocytes Relative: 7.6 %
Neutro Abs: 4976 cells/uL (ref 1500–7800)
Neutrophils Relative %: 63.8 %
Platelets: 313 10*3/uL (ref 140–400)
RBC: 4.47 10*6/uL (ref 3.80–5.10)
RDW: 12.5 % (ref 11.0–15.0)
Total Lymphocyte: 25.6 %
WBC: 7.8 10*3/uL (ref 3.8–10.8)

## 2022-06-06 LAB — LIPID PANEL W/REFLEX DIRECT LDL
Cholesterol: 193 mg/dL (ref <200)
HDL: 59 mg/dL (ref >=50)
LDL Cholesterol (Calc): 118 mg/dL (calc) — ABNORMAL HIGH
Non-HDL Cholesterol (Calc): 134 mg/dL (calc) — ABNORMAL HIGH (ref <130)
Total CHOL/HDL Ratio: 3.3 (calc) (ref <5.0)
Triglycerides: 68 mg/dL (ref <150)

## 2022-06-06 LAB — TSH: TSH: 1.22 mIU/L

## 2022-06-06 LAB — FSH/LH
FSH: 18.6 m[IU]/mL
LH: 9 m[IU]/mL

## 2022-06-06 LAB — CORTISOL: Cortisol, Plasma: 7.6 ug/dL

## 2022-06-06 LAB — THYROID PEROXIDASE ANTIBODY: Thyroperoxidase Ab SerPl-aCnc: 1 IU/mL (ref <9)

## 2022-06-06 LAB — ESTRADIOL: Estradiol: 50 pg/mL

## 2022-06-07 LAB — VITAMIN D 25 HYDROXY (VIT D DEFICIENCY, FRACTURES): Vit D, 25-Hydroxy: 38 ng/mL (ref 30–100)

## 2022-06-07 LAB — B12 AND FOLATE PANEL
Folate: >24 ng/mL
Vitamin B-12: 573 pg/mL (ref 200–1100)

## 2022-06-07 LAB — IRON,TIBC AND FERRITIN PANEL
%SAT: 19 % (calc) (ref 16–45)
Ferritin: 40 ng/mL (ref 16–232)
Iron: 78 ug/dL (ref 40–190)
TIBC: 419 mcg/dL (calc) (ref 250–450)

## 2022-06-10 NOTE — Progress Notes (Signed)
Jill Lloyd,   Vitamin D normal range but low normal.  Start 2000 units daily with dairy.  B12 looks great.  Iron and iron stores look good.

## 2022-06-10 NOTE — Progress Notes (Signed)
Guyla,   Cholesterol is stable. HDL, good cholesterol, looks great.  Overall 10 year risk low.   Marland Kitchen.The 10-year ASCVD risk score (Arnett DK, et al., 2019) is: 0.6%   Values used to calculate the score:     Age: 42 years     Sex: Female     Is Non-Hispanic African American: No     Diabetic: No     Tobacco smoker: No     Systolic Blood Pressure: 137 mmHg     Is BP treated: No     HDL Cholesterol: 59 mg/dL     Total Cholesterol: 193 mg/dL  Kidney,glucose looks great.  Liver enzymes still mildly elevated.  Would like to order liver ultrasound are you ok with this?  Thyroid looks great.  Hemoglobin looks good.

## 2022-06-10 NOTE — Progress Notes (Signed)
Estrogen on low side of normal.  Cortisol normal.

## 2022-06-16 DIAGNOSIS — M4726 Other spondylosis with radiculopathy, lumbar region: Secondary | ICD-10-CM | POA: Diagnosis not present

## 2022-06-16 DIAGNOSIS — M4723 Other spondylosis with radiculopathy, cervicothoracic region: Secondary | ICD-10-CM | POA: Diagnosis not present

## 2022-06-16 DIAGNOSIS — M25531 Pain in right wrist: Secondary | ICD-10-CM | POA: Diagnosis not present

## 2022-06-16 DIAGNOSIS — M4728 Other spondylosis with radiculopathy, sacral and sacrococcygeal region: Secondary | ICD-10-CM | POA: Diagnosis not present

## 2022-06-17 NOTE — Addendum Note (Signed)
Addended by: Jomarie Longs on: 06/17/2022 08:56 AM   Modules accepted: Orders

## 2022-06-23 ENCOUNTER — Other Ambulatory Visit: Payer: Self-pay

## 2022-06-23 NOTE — Telephone Encounter (Signed)
Requesting rx rf of Wegovy Last written 06/02/2022 Last OV 06/02/2022 Should strength be increased to 0.5mg  ? No upcoming appt schld.

## 2022-06-24 MED ORDER — WEGOVY 0.5 MG/0.5ML ~~LOC~~ SOAJ: 0.5000 mg | SUBCUTANEOUS | 1 refills | Status: DC

## 2022-07-07 DIAGNOSIS — N3000 Acute cystitis without hematuria: Secondary | ICD-10-CM | POA: Diagnosis not present

## 2022-07-07 DIAGNOSIS — T3695XA Adverse effect of unspecified systemic antibiotic, initial encounter: Secondary | ICD-10-CM | POA: Diagnosis not present

## 2022-07-07 DIAGNOSIS — B379 Candidiasis, unspecified: Secondary | ICD-10-CM | POA: Diagnosis not present

## 2022-07-08 DIAGNOSIS — M4726 Other spondylosis with radiculopathy, lumbar region: Secondary | ICD-10-CM | POA: Diagnosis not present

## 2022-07-08 DIAGNOSIS — M4723 Other spondylosis with radiculopathy, cervicothoracic region: Secondary | ICD-10-CM | POA: Diagnosis not present

## 2022-07-08 DIAGNOSIS — M25531 Pain in right wrist: Secondary | ICD-10-CM | POA: Diagnosis not present

## 2022-07-08 DIAGNOSIS — M4728 Other spondylosis with radiculopathy, sacral and sacrococcygeal region: Secondary | ICD-10-CM | POA: Diagnosis not present

## 2022-07-18 ENCOUNTER — Ambulatory Visit (INDEPENDENT_AMBULATORY_CARE_PROVIDER_SITE_OTHER): Payer: BC Managed Care – PPO

## 2022-07-18 DIAGNOSIS — R748 Abnormal levels of other serum enzymes: Secondary | ICD-10-CM | POA: Diagnosis not present

## 2022-07-18 DIAGNOSIS — K7689 Other specified diseases of liver: Secondary | ICD-10-CM | POA: Diagnosis not present

## 2022-07-18 DIAGNOSIS — R7989 Other specified abnormal findings of blood chemistry: Secondary | ICD-10-CM | POA: Diagnosis not present

## 2022-07-18 NOTE — Progress Notes (Signed)
Hi Jill Lloyd,   US shows fatty liver.  This is often secondary to diet and weight gain.  The treatment if healthy diet with lots of vegetable, lean proteins and whole grains and low on the carbs.  Regular exercise helps as well. This helps reduce the stress on the liver.  Plan to recheck your enzymes in August.

## 2022-08-06 ENCOUNTER — Encounter: Payer: Self-pay | Admitting: Physician Assistant

## 2022-08-06 DIAGNOSIS — K760 Fatty (change of) liver, not elsewhere classified: Secondary | ICD-10-CM | POA: Insufficient documentation

## 2022-08-06 DIAGNOSIS — R748 Abnormal levels of other serum enzymes: Secondary | ICD-10-CM

## 2022-08-06 DIAGNOSIS — E8881 Metabolic syndrome: Secondary | ICD-10-CM | POA: Insufficient documentation

## 2022-08-06 MED ORDER — OZEMPIC (0.25 OR 0.5 MG/DOSE) 2 MG/3ML ~~LOC~~ SOPN: 0.2500 mg | PEN_INJECTOR | SUBCUTANEOUS | 0 refills | Status: DC

## 2022-08-06 NOTE — Telephone Encounter (Signed)
Attempted call to patient to go over results in chart regarding liver US- left a voice mail message requesting a return call.

## 2022-08-12 NOTE — Telephone Encounter (Signed)
Attempted call to patient. Left a voice mail message  to return our call.

## 2022-08-14 NOTE — Telephone Encounter (Signed)
Attempted call to patient. Left a voice mail message requesting a return call.  

## 2022-08-20 ENCOUNTER — Telehealth: Payer: Self-pay

## 2022-08-20 NOTE — Telephone Encounter (Signed)
Initiated Prior authorization HYQ:MVHQION (0.25 or 0.5 MG/DOSE) 2MG /3ML pen-injectors Via: Covermymeds Case/Key:[id number] Status: n/a as of 08/20/22 Reason:Drug is covered by current benefit plan. No further PA activity needed Pt has a high co-pay due to not meeting the deductible   Notified Pt via: Mychart

## 2022-08-20 NOTE — Telephone Encounter (Signed)
Attempted call to patient. Left a voice mail message  to return our call.

## 2022-08-27 NOTE — Telephone Encounter (Signed)
Message sent to patient via My chart by Ernest Mallick.

## 2022-09-29 DIAGNOSIS — M4723 Other spondylosis with radiculopathy, cervicothoracic region: Secondary | ICD-10-CM | POA: Diagnosis not present

## 2022-09-29 DIAGNOSIS — M25531 Pain in right wrist: Secondary | ICD-10-CM | POA: Diagnosis not present

## 2022-09-29 DIAGNOSIS — M4726 Other spondylosis with radiculopathy, lumbar region: Secondary | ICD-10-CM | POA: Diagnosis not present

## 2022-09-29 DIAGNOSIS — M4728 Other spondylosis with radiculopathy, sacral and sacrococcygeal region: Secondary | ICD-10-CM | POA: Diagnosis not present

## 2022-10-15 ENCOUNTER — Other Ambulatory Visit: Payer: Self-pay | Admitting: Physician Assistant

## 2022-10-15 DIAGNOSIS — R748 Abnormal levels of other serum enzymes: Secondary | ICD-10-CM

## 2022-10-15 DIAGNOSIS — K760 Fatty (change of) liver, not elsewhere classified: Secondary | ICD-10-CM

## 2022-10-15 DIAGNOSIS — E8881 Metabolic syndrome: Secondary | ICD-10-CM

## 2022-10-15 DIAGNOSIS — E66813 Obesity, class 3: Secondary | ICD-10-CM

## 2022-10-21 ENCOUNTER — Encounter: Payer: Self-pay | Admitting: Physician Assistant

## 2022-10-21 DIAGNOSIS — K219 Gastro-esophageal reflux disease without esophagitis: Secondary | ICD-10-CM

## 2022-10-21 MED ORDER — OMEPRAZOLE 40 MG PO CPDR: DELAYED_RELEASE_CAPSULE | ORAL | 3 refills | Status: DC

## 2022-11-05 DIAGNOSIS — D225 Melanocytic nevi of trunk: Secondary | ICD-10-CM | POA: Diagnosis not present

## 2022-11-05 DIAGNOSIS — D485 Neoplasm of uncertain behavior of skin: Secondary | ICD-10-CM | POA: Diagnosis not present

## 2022-11-05 DIAGNOSIS — L719 Rosacea, unspecified: Secondary | ICD-10-CM | POA: Diagnosis not present

## 2022-11-05 DIAGNOSIS — L821 Other seborrheic keratosis: Secondary | ICD-10-CM | POA: Diagnosis not present

## 2022-11-05 DIAGNOSIS — L814 Other melanin hyperpigmentation: Secondary | ICD-10-CM | POA: Diagnosis not present

## 2022-11-06 ENCOUNTER — Encounter: Payer: Self-pay | Admitting: Physician Assistant

## 2022-11-07 MED ORDER — TRULANCE 3 MG PO TABS: ORAL_TABLET | ORAL | 2 refills | Status: DC

## 2022-11-12 DIAGNOSIS — M4728 Other spondylosis with radiculopathy, sacral and sacrococcygeal region: Secondary | ICD-10-CM | POA: Diagnosis not present

## 2022-11-12 DIAGNOSIS — M4726 Other spondylosis with radiculopathy, lumbar region: Secondary | ICD-10-CM | POA: Diagnosis not present

## 2022-11-12 DIAGNOSIS — R92323 Mammographic fibroglandular density, bilateral breasts: Secondary | ICD-10-CM | POA: Diagnosis not present

## 2022-11-12 DIAGNOSIS — M4723 Other spondylosis with radiculopathy, cervicothoracic region: Secondary | ICD-10-CM | POA: Diagnosis not present

## 2022-11-12 DIAGNOSIS — M25531 Pain in right wrist: Secondary | ICD-10-CM | POA: Diagnosis not present

## 2022-11-12 DIAGNOSIS — Z1231 Encounter for screening mammogram for malignant neoplasm of breast: Secondary | ICD-10-CM | POA: Diagnosis not present

## 2022-11-12 LAB — HM MAMMOGRAPHY

## 2022-11-23 ENCOUNTER — Other Ambulatory Visit: Payer: Self-pay | Admitting: Physician Assistant

## 2022-11-23 DIAGNOSIS — K760 Fatty (change of) liver, not elsewhere classified: Secondary | ICD-10-CM

## 2022-11-23 DIAGNOSIS — E8881 Metabolic syndrome: Secondary | ICD-10-CM

## 2022-11-23 DIAGNOSIS — R748 Abnormal levels of other serum enzymes: Secondary | ICD-10-CM

## 2022-11-23 DIAGNOSIS — E66813 Obesity, class 3: Secondary | ICD-10-CM

## 2022-11-24 DIAGNOSIS — R92321 Mammographic fibroglandular density, right breast: Secondary | ICD-10-CM | POA: Diagnosis not present

## 2022-11-24 DIAGNOSIS — R9230 Dense breasts, unspecified: Secondary | ICD-10-CM | POA: Diagnosis not present

## 2022-11-24 DIAGNOSIS — N6489 Other specified disorders of breast: Secondary | ICD-10-CM | POA: Diagnosis not present

## 2022-11-24 LAB — HM MAMMOGRAPHY

## 2022-12-02 DIAGNOSIS — M25531 Pain in right wrist: Secondary | ICD-10-CM | POA: Diagnosis not present

## 2022-12-02 DIAGNOSIS — M4726 Other spondylosis with radiculopathy, lumbar region: Secondary | ICD-10-CM | POA: Diagnosis not present

## 2022-12-02 DIAGNOSIS — M4728 Other spondylosis with radiculopathy, sacral and sacrococcygeal region: Secondary | ICD-10-CM | POA: Diagnosis not present

## 2022-12-02 DIAGNOSIS — M4723 Other spondylosis with radiculopathy, cervicothoracic region: Secondary | ICD-10-CM | POA: Diagnosis not present

## 2023-01-06 ENCOUNTER — Other Ambulatory Visit: Payer: Self-pay | Admitting: Physician Assistant

## 2023-01-06 DIAGNOSIS — E8881 Metabolic syndrome: Secondary | ICD-10-CM

## 2023-01-06 DIAGNOSIS — R748 Abnormal levels of other serum enzymes: Secondary | ICD-10-CM

## 2023-01-06 DIAGNOSIS — E66813 Obesity, class 3: Secondary | ICD-10-CM

## 2023-01-06 DIAGNOSIS — K760 Fatty (change of) liver, not elsewhere classified: Secondary | ICD-10-CM

## 2023-01-09 MED ORDER — OZEMPIC (0.25 OR 0.5 MG/DOSE) 2 MG/3ML ~~LOC~~ SOPN: 0.5000 mg | PEN_INJECTOR | SUBCUTANEOUS | 0 refills | Status: DC

## 2023-01-19 DIAGNOSIS — M4723 Other spondylosis with radiculopathy, cervicothoracic region: Secondary | ICD-10-CM | POA: Diagnosis not present

## 2023-01-19 DIAGNOSIS — M4728 Other spondylosis with radiculopathy, sacral and sacrococcygeal region: Secondary | ICD-10-CM | POA: Diagnosis not present

## 2023-01-19 DIAGNOSIS — M4726 Other spondylosis with radiculopathy, lumbar region: Secondary | ICD-10-CM | POA: Diagnosis not present

## 2023-01-19 DIAGNOSIS — M25531 Pain in right wrist: Secondary | ICD-10-CM | POA: Diagnosis not present

## 2023-01-29 DIAGNOSIS — M25531 Pain in right wrist: Secondary | ICD-10-CM | POA: Diagnosis not present

## 2023-01-29 DIAGNOSIS — M4723 Other spondylosis with radiculopathy, cervicothoracic region: Secondary | ICD-10-CM | POA: Diagnosis not present

## 2023-01-29 DIAGNOSIS — M4728 Other spondylosis with radiculopathy, sacral and sacrococcygeal region: Secondary | ICD-10-CM | POA: Diagnosis not present

## 2023-01-29 DIAGNOSIS — M4726 Other spondylosis with radiculopathy, lumbar region: Secondary | ICD-10-CM | POA: Diagnosis not present

## 2023-01-30 ENCOUNTER — Other Ambulatory Visit: Payer: Self-pay | Admitting: Physician Assistant

## 2023-01-30 DIAGNOSIS — E8881 Metabolic syndrome: Secondary | ICD-10-CM

## 2023-01-30 DIAGNOSIS — K760 Fatty (change of) liver, not elsewhere classified: Secondary | ICD-10-CM

## 2023-01-30 DIAGNOSIS — R748 Abnormal levels of other serum enzymes: Secondary | ICD-10-CM

## 2023-01-30 DIAGNOSIS — E66813 Obesity, class 3: Secondary | ICD-10-CM

## 2023-02-02 DIAGNOSIS — M4723 Other spondylosis with radiculopathy, cervicothoracic region: Secondary | ICD-10-CM | POA: Diagnosis not present

## 2023-02-02 DIAGNOSIS — M4728 Other spondylosis with radiculopathy, sacral and sacrococcygeal region: Secondary | ICD-10-CM | POA: Diagnosis not present

## 2023-02-02 DIAGNOSIS — M4726 Other spondylosis with radiculopathy, lumbar region: Secondary | ICD-10-CM | POA: Diagnosis not present

## 2023-02-02 DIAGNOSIS — M25531 Pain in right wrist: Secondary | ICD-10-CM | POA: Diagnosis not present

## 2023-02-09 DIAGNOSIS — M4723 Other spondylosis with radiculopathy, cervicothoracic region: Secondary | ICD-10-CM | POA: Diagnosis not present

## 2023-02-09 DIAGNOSIS — M25531 Pain in right wrist: Secondary | ICD-10-CM | POA: Diagnosis not present

## 2023-02-09 DIAGNOSIS — M4728 Other spondylosis with radiculopathy, sacral and sacrococcygeal region: Secondary | ICD-10-CM | POA: Diagnosis not present

## 2023-02-09 DIAGNOSIS — M4726 Other spondylosis with radiculopathy, lumbar region: Secondary | ICD-10-CM | POA: Diagnosis not present

## 2023-02-23 DIAGNOSIS — L821 Other seborrheic keratosis: Secondary | ICD-10-CM | POA: Diagnosis not present

## 2023-02-23 DIAGNOSIS — D485 Neoplasm of uncertain behavior of skin: Secondary | ICD-10-CM | POA: Diagnosis not present

## 2023-03-23 DIAGNOSIS — M4728 Other spondylosis with radiculopathy, sacral and sacrococcygeal region: Secondary | ICD-10-CM | POA: Diagnosis not present

## 2023-03-23 DIAGNOSIS — M4723 Other spondylosis with radiculopathy, cervicothoracic region: Secondary | ICD-10-CM | POA: Diagnosis not present

## 2023-03-23 DIAGNOSIS — M25531 Pain in right wrist: Secondary | ICD-10-CM | POA: Diagnosis not present

## 2023-03-23 DIAGNOSIS — M4726 Other spondylosis with radiculopathy, lumbar region: Secondary | ICD-10-CM | POA: Diagnosis not present

## 2023-04-28 ENCOUNTER — Ambulatory Visit: Admitting: Physician Assistant

## 2023-04-28 DIAGNOSIS — M25551 Pain in right hip: Secondary | ICD-10-CM | POA: Diagnosis not present

## 2023-05-11 DIAGNOSIS — M4723 Other spondylosis with radiculopathy, cervicothoracic region: Secondary | ICD-10-CM | POA: Diagnosis not present

## 2023-05-11 DIAGNOSIS — M4726 Other spondylosis with radiculopathy, lumbar region: Secondary | ICD-10-CM | POA: Diagnosis not present

## 2023-05-11 DIAGNOSIS — M25531 Pain in right wrist: Secondary | ICD-10-CM | POA: Diagnosis not present

## 2023-05-11 DIAGNOSIS — M4728 Other spondylosis with radiculopathy, sacral and sacrococcygeal region: Secondary | ICD-10-CM | POA: Diagnosis not present

## 2023-05-12 ENCOUNTER — Other Ambulatory Visit: Payer: Self-pay

## 2023-05-13 ENCOUNTER — Encounter: Payer: Self-pay | Admitting: Physician Assistant

## 2023-05-13 ENCOUNTER — Other Ambulatory Visit: Payer: Self-pay

## 2023-05-13 DIAGNOSIS — E66813 Obesity, class 3: Secondary | ICD-10-CM

## 2023-05-13 DIAGNOSIS — K760 Fatty (change of) liver, not elsewhere classified: Secondary | ICD-10-CM

## 2023-05-13 DIAGNOSIS — E8881 Metabolic syndrome: Secondary | ICD-10-CM

## 2023-05-13 DIAGNOSIS — R748 Abnormal levels of other serum enzymes: Secondary | ICD-10-CM

## 2023-05-13 MED ORDER — OZEMPIC (0.25 OR 0.5 MG/DOSE) 2 MG/3ML ~~LOC~~ SOPN: PEN_INJECTOR | SUBCUTANEOUS | 0 refills | Status: DC

## 2023-05-14 ENCOUNTER — Telehealth: Payer: Self-pay

## 2023-05-14 ENCOUNTER — Telehealth: Payer: Self-pay | Admitting: Physician Assistant

## 2023-05-14 DIAGNOSIS — Z0000 Encounter for general adult medical examination without abnormal findings: Secondary | ICD-10-CM

## 2023-05-14 DIAGNOSIS — Z13220 Encounter for screening for lipoid disorders: Secondary | ICD-10-CM

## 2023-05-14 DIAGNOSIS — E559 Vitamin D deficiency, unspecified: Secondary | ICD-10-CM

## 2023-05-14 DIAGNOSIS — Z131 Encounter for screening for diabetes mellitus: Secondary | ICD-10-CM

## 2023-05-14 MED ORDER — WEGOVY 0.5 MG/0.5ML ~~LOC~~ SOAJ: 0.5000 mg | SUBCUTANEOUS | 0 refills | Status: DC

## 2023-05-14 NOTE — Telephone Encounter (Signed)
 Physical schld for 06/05/23

## 2023-05-14 NOTE — Telephone Encounter (Signed)
 O.k. to change to 3 month supply and resend?

## 2023-05-14 NOTE — Telephone Encounter (Signed)
 Copied from CRM [id number]. Topic: Clinical - Request for Lab/Test Order >> May 14, 2023 10:41 AM Jill Lloyd F wrote: Reason for CRM:   Patient would like all physical related labs sent in so she can have them preformed before her physical appointment for review.   Please call patient back once placed for scheduling.  Callback Number: [id number]

## 2023-05-14 NOTE — Telephone Encounter (Signed)
 Copied from CRM [id number]. Topic: Clinical - Medication Question >> May 14, 2023 10:36 AM Danelle Dunning F wrote: Reason for CRM:   Medication in Question : Semaglutide ,0.25 or 0.5MG /DOS, (OZEMPIC , 0.25 OR 0.5 MG/DOSE,) 2 MG/3ML SOPN  Patient contacted the office stating that her pharmacy [facility] is in need of a 90 day prescription for the medication listed to mail it. Please resubmit prescription as a 90 day supply to:   [facility] - [place], [place] - [address] [place] [address] Phone: [phone] Fax: [phone] Hours: Not open 24 hours  Patient is open to being messaged on MyChart or called whichever is easier for the office staff to relay the prescription has been sent.   Callback Number: [phone]

## 2023-05-18 MED ORDER — ZEPBOUND 5 MG/0.5ML ~~LOC~~ SOAJ: 5.0000 mg | SUBCUTANEOUS | 0 refills | Status: DC

## 2023-05-18 NOTE — Telephone Encounter (Signed)
 Called and left detailed voice mail on pt listed home # ( allowed on DPR )

## 2023-05-18 NOTE — Addendum Note (Signed)
 Addended by: Araceli Knight on: 05/18/2023 01:18 PM   Modules accepted: Orders

## 2023-05-18 NOTE — Addendum Note (Signed)
 Addended by: Araceli Knight on: 05/18/2023 01:47 PM   Modules accepted: Orders

## 2023-05-18 NOTE — Telephone Encounter (Signed)
 Pt has tried over a year of weight loss efforts with diet and exercise. Pt has fatty liver and elevated liver enzymes with obesity.  She has failed phentermine.

## 2023-05-18 NOTE — Addendum Note (Signed)
 Addended by: Araceli Knight on: 05/18/2023 01:17 PM   Modules accepted: Orders

## 2023-05-21 ENCOUNTER — Other Ambulatory Visit (HOSPITAL_COMMUNITY): Payer: Self-pay

## 2023-05-21 ENCOUNTER — Telehealth: Payer: Self-pay

## 2023-05-21 NOTE — Telephone Encounter (Signed)
 Pharmacy Patient Advocate Encounter   Received notification from CoverMyMeds that prior authorization for ZEPBOUND) 5 MG/0.5ML Pen  is required/requested.   Insurance verification completed.   The patient is insured through [employer] .   Per test claim: PA required; PA submitted to above mentioned insurance via CoverMyMeds Key/confirmation #/EOC [id number] Status is pending     Received notification from [facility] that Prior Authorization for ZEPBOUND) 5 MG/0.5ML Pen has been APPROVED from 04/21/2023 to 01/16/2024. Ran test claim, Copay is $24.99. This test claim was processed through [facility]- copay amounts may vary at other pharmacies due to pharmacy/plan contracts, or as the patient moves through the different stages of their insurance plan.   PA #/Case ID/Reference #: [id number]

## 2023-06-01 DIAGNOSIS — M4728 Other spondylosis with radiculopathy, sacral and sacrococcygeal region: Secondary | ICD-10-CM | POA: Diagnosis not present

## 2023-06-01 DIAGNOSIS — E559 Vitamin D deficiency, unspecified: Secondary | ICD-10-CM | POA: Diagnosis not present

## 2023-06-01 DIAGNOSIS — M25531 Pain in right wrist: Secondary | ICD-10-CM | POA: Diagnosis not present

## 2023-06-01 DIAGNOSIS — R7309 Other abnormal glucose: Secondary | ICD-10-CM | POA: Diagnosis not present

## 2023-06-01 DIAGNOSIS — Z13220 Encounter for screening for lipoid disorders: Secondary | ICD-10-CM | POA: Diagnosis not present

## 2023-06-01 DIAGNOSIS — Z0000 Encounter for general adult medical examination without abnormal findings: Secondary | ICD-10-CM | POA: Diagnosis not present

## 2023-06-01 DIAGNOSIS — M4723 Other spondylosis with radiculopathy, cervicothoracic region: Secondary | ICD-10-CM | POA: Diagnosis not present

## 2023-06-01 DIAGNOSIS — M4726 Other spondylosis with radiculopathy, lumbar region: Secondary | ICD-10-CM | POA: Diagnosis not present

## 2023-06-01 DIAGNOSIS — Z131 Encounter for screening for diabetes mellitus: Secondary | ICD-10-CM | POA: Diagnosis not present

## 2023-06-02 ENCOUNTER — Ambulatory Visit: Payer: Self-pay | Admitting: Physician Assistant

## 2023-06-02 LAB — CMP14+EGFR
ALT: 39 IU/L — ABNORMAL HIGH (ref 0–32)
AST: 24 IU/L (ref 0–40)
Albumin: 4.5 g/dL (ref 3.9–4.9)
Alkaline Phosphatase: 76 IU/L (ref 44–121)
BUN/Creatinine Ratio: 15 (ref 9–23)
BUN: 10 mg/dL (ref 6–24)
Bilirubin Total: 0.6 mg/dL (ref 0.0–1.2)
CO2: 22 mmol/L (ref 20–29)
Calcium: 9.6 mg/dL (ref 8.7–10.2)
Chloride: 104 mmol/L (ref 96–106)
Creatinine, Ser: 0.68 mg/dL (ref 0.57–1.00)
Globulin, Total: 2.5 g/dL (ref 1.5–4.5)
Glucose: 107 mg/dL — ABNORMAL HIGH (ref 70–99)
Potassium: 4.5 mmol/L (ref 3.5–5.2)
Sodium: 143 mmol/L (ref 134–144)
Total Protein: 7 g/dL (ref 6.0–8.5)
eGFR: 111 mL/min/{1.73_m2} (ref >59)

## 2023-06-02 LAB — CBC WITH DIFFERENTIAL/PLATELET
Basophils Absolute: 0 10*3/uL (ref 0.0–0.2)
Basos: 1 %
EOS (ABSOLUTE): 0.1 10*3/uL (ref 0.0–0.4)
Eos: 2 %
Hematocrit: 38.5 % (ref 34.0–46.6)
Hemoglobin: 12.5 g/dL (ref 11.1–15.9)
Immature Grans (Abs): 0 10*3/uL (ref 0.0–0.1)
Immature Granulocytes: 0 %
Lymphocytes Absolute: 2.7 10*3/uL (ref 0.7–3.1)
Lymphs: 35 %
MCH: 28.9 pg (ref 26.6–33.0)
MCHC: 32.5 g/dL (ref 31.5–35.7)
MCV: 89 fL (ref 79–97)
Monocytes Absolute: 0.6 10*3/uL (ref 0.1–0.9)
Monocytes: 8 %
Neutrophils Absolute: 4.2 10*3/uL (ref 1.4–7.0)
Neutrophils: 54 %
Platelets: 282 10*3/uL (ref 150–450)
RBC: 4.33 x10E6/uL (ref 3.77–5.28)
RDW: 13.2 % (ref 11.7–15.4)
WBC: 7.7 10*3/uL (ref 3.4–10.8)

## 2023-06-02 LAB — LIPID PANEL
Chol/HDL Ratio: 3.1 ratio (ref 0.0–4.4)
Cholesterol, Total: 182 mg/dL (ref 100–199)
HDL: 58 mg/dL (ref >39)
LDL Chol Calc (NIH): 107 mg/dL — ABNORMAL HIGH (ref 0–99)
Triglycerides: 96 mg/dL (ref 0–149)
VLDL Cholesterol Cal: 17 mg/dL (ref 5–40)

## 2023-06-02 LAB — TSH+FREE T4
Free T4: 1.05 ng/dL (ref 0.82–1.77)
TSH: 1.68 u[IU]/mL (ref 0.450–4.500)

## 2023-06-02 LAB — VITAMIN D 25 HYDROXY (VIT D DEFICIENCY, FRACTURES): Vit D, 25-Hydroxy: 75.9 ng/mL (ref 30.0–100.0)

## 2023-06-02 NOTE — Progress Notes (Signed)
 Will discuss on 5/23. No acute concerns.   Glucose is elevated. I would like a nurse to add A1C to blood to get a better evaluation for diabetes.

## 2023-06-03 LAB — HGB A1C W/O EAG: Hgb A1c MFr Bld: 5.7 % — ABNORMAL HIGH (ref 4.8–5.6)

## 2023-06-03 LAB — SPECIMEN STATUS REPORT

## 2023-06-03 NOTE — Progress Notes (Signed)
 A1C in pre-diabetes range. Will discuss at upcoming appt.

## 2023-06-05 ENCOUNTER — Ambulatory Visit (INDEPENDENT_AMBULATORY_CARE_PROVIDER_SITE_OTHER): Admitting: Physician Assistant

## 2023-06-05 VITALS — BP 129/72 | HR 66 | Ht 64.0 in | Wt 222.6 lb

## 2023-06-05 DIAGNOSIS — Z0000 Encounter for general adult medical examination without abnormal findings: Secondary | ICD-10-CM

## 2023-06-05 DIAGNOSIS — Z6838 Body mass index (BMI) 38.0-38.9, adult: Secondary | ICD-10-CM | POA: Diagnosis not present

## 2023-06-05 DIAGNOSIS — K760 Fatty (change of) liver, not elsewhere classified: Secondary | ICD-10-CM

## 2023-06-05 DIAGNOSIS — R7303 Prediabetes: Secondary | ICD-10-CM | POA: Insufficient documentation

## 2023-06-05 DIAGNOSIS — E6609 Other obesity due to excess calories: Secondary | ICD-10-CM

## 2023-06-05 DIAGNOSIS — E8881 Metabolic syndrome: Secondary | ICD-10-CM

## 2023-06-05 DIAGNOSIS — E66812 Obesity, class 2: Secondary | ICD-10-CM

## 2023-06-05 NOTE — Patient Instructions (Signed)

## 2023-06-05 NOTE — Progress Notes (Signed)
 Complete physical exam  Patient: Jill Lloyd   DOB: 06-27-1980   43 y.o. Female  MRN: [id number]  Subjective:     Chief Complaint  Patient presents with   Annual Exam    Jill Lloyd is a 43 y.o. female who presents today for a complete physical exam. She reports consuming a general diet. The patient does not participate in regular exercise at present. She generally feels well. She reports sleeping well. She does not have additional problems to discuss today.   She has her 5mg  of zepbound  that was approved but was never given the 2.5mg  of zepbound . She is worried about starting at 5mg .    Most recent fall risk assessment:    06/06/2023    7:25 AM  Fall Risk   Falls in the past year? 0  Number falls in past yr: 0  Injury with Fall? 0  Risk for fall due to : No Fall Risks     Most recent depression screenings:    06/06/2023    7:25 AM 03/25/2021   10:32 AM  PHQ 2/9 Scores  PHQ - 2 Score 0 0    Vision:Within last year and Dental: No current dental problems and Receives regular dental care  Patient Active Problem List   Diagnosis Date Noted   Prediabetes 06/05/2023   Hepatic steatosis 08/06/2022   Class 3 severe obesity due to excess calories without serious comorbidity with body mass index (BMI) of 40.0 to 44.9 in adult 08/06/2022   Metabolic syndrome 08/06/2022   Vitamin D  deficiency 06/03/2022   Dysuria 06/03/2022   Elevated liver enzymes 06/02/2022   Gross hematuria 06/19/2020   Acute cystitis with hematuria 06/19/2020   Gastroesophageal reflux disease 03/19/2020   Chronic cystitis 03/21/2019   Lipoma of right forearm 12/28/2018   Family history of breast cancer 12/15/2018   Numbness and tingling in right hand 12/15/2018   Class 2 obesity due to excess calories without serious comorbidity with body mass index (BMI) of 38.0 to 38.9 in adult 12/13/2018   Frequent UTI 12/13/2018   Arm mass, right 12/13/2018   Dysthymia 12/13/2018   Past Medical History:   Diagnosis Date   Depression    Frequent UTI    Past Surgical History:  Procedure Laterality Date   ABLATION     CESAREAN SECTION     Family History  Problem Relation Age of Onset   Breast cancer Mother    Breast cancer Maternal Aunt    Breast cancer Maternal Grandmother    Colon cancer Other    Skin cancer Other    Allergies  Allergen Reactions   Phentermine Hcl Shortness Of Breath      Patient Care Team: Haik Mahoney L, PA-C as PCP - General (Family Medicine) Speaks, Baron Border, MD as Referring Physician (Obstetrics)   Outpatient Medications Prior to Visit  Medication Sig   CRANBERRY PO Take by mouth daily.   omeprazole  (PRILOSEC) 40 MG capsule TAKE 1 CAPSULE BY MOUTH EVERY DAY.   tirzepatide  (ZEPBOUND ) 5 MG/0.5ML Pen Inject 5 mg into the skin once a week. (Patient not taking: Reported on 06/05/2023)   [DISCONTINUED] Plecanatide  (TRULANCE ) 3 MG TABS Take one tablet daily.   No facility-administered medications prior to visit.    Review of Systems  All other systems reviewed and are negative.         Objective:     BP 129/72   Pulse 66   Ht 5\' 4"  (1.626 m)   Wt  222 lb 9.6 oz (101 kg) Comment: 222.6lb  SpO2 98%   BMI 38.21 kg/m  BP Readings from Last 3 Encounters:  06/05/23 129/72  06/02/22 137/78  04/22/21 129/86   Wt Readings from Last 3 Encounters:  06/05/23 222 lb 9.6 oz (101 kg)  06/02/22 234 lb (106.1 kg)  03/25/21 226 lb (102.5 kg)      Physical Exam  BP 129/72   Pulse 66   Ht 5\' 4"  (1.626 m)   Wt 222 lb 9.6 oz (101 kg) Comment: 222.6lb  SpO2 98%   BMI 38.21 kg/m   General Appearance:    Alert, cooperative, obese no distress, appears stated age  Head:    Normocephalic, without obvious abnormality, atraumatic  Eyes:    PERRL, conjunctiva/corneas clear, EOM's intact, fundi    benign, both eyes  Ears:    Normal TM's and external ear canals, both ears  Nose:   Nares normal, septum midline, mucosa normal, no drainage    or sinus  tenderness  Throat:   Lips, mucosa, and tongue normal; teeth and gums normal  Neck:   Supple, symmetrical, trachea midline, no adenopathy;    thyroid :  no enlargement/tenderness/nodules; no carotid   bruit or JVD  Back:     Symmetric, no curvature, ROM normal, no CVA tenderness  Lungs:     Clear to auscultation bilaterally, respirations unlabored  Chest Wall:    No tenderness or deformity   Heart:    Regular rate and rhythm, S1 and S2 normal, no murmur, rub   or gallop     Abdomen:     Soft, non-tender, bowel sounds active all four quadrants,    no masses, no organomegaly        Extremities:   Extremities normal, atraumatic, no cyanosis or edema  Pulses:   2+ and symmetric all extremities  Skin:   Skin color, texture, turgor normal, no rashes or lesions  Lymph nodes:   Cervical, supraclavicular, and axillary nodes normal  Neurologic:   CNII-XII intact, normal strength, sensation and reflexes    throughout   Last CBC Lab Results  Component Value Date   WBC 7.7 06/01/2023   HGB 12.5 06/01/2023   HCT 38.5 06/01/2023   MCV 89 06/01/2023   MCH 28.9 06/01/2023   RDW 13.2 06/01/2023   PLT 282 06/01/2023   Last metabolic panel Lab Results  Component Value Date   GLUCOSE 107 (H) 06/01/2023   NA 143 06/01/2023   K 4.5 06/01/2023   CL 104 06/01/2023   CO2 22 06/01/2023   BUN 10 06/01/2023   CREATININE 0.68 06/01/2023   EGFR 111 06/01/2023   CALCIUM 9.6 06/01/2023   PROT 7.0 06/01/2023   ALBUMIN 4.5 06/01/2023   LABGLOB 2.5 06/01/2023   BILITOT 0.6 06/01/2023   ALKPHOS 76 06/01/2023   AST 24 06/01/2023   ALT 39 (H) 06/01/2023   Last lipids Lab Results  Component Value Date   CHOL 182 06/01/2023   HDL 58 06/01/2023   LDLCALC 107 (H) 06/01/2023   TRIG 96 06/01/2023   CHOLHDL 3.1 06/01/2023   Last hemoglobin A1c Lab Results  Component Value Date   HGBA1C 5.7 (H) 06/01/2023   Last thyroid  functions Lab Results  Component Value Date   TSH 1.680 06/01/2023   Last  vitamin D  Lab Results  Component Value Date   VD25OH 75.9 06/01/2023   Last vitamin B12 and Folate Lab Results  Component Value Date   VITAMINB12 573 06/05/2022  FOLATE >24.0 06/05/2022        Assessment & Plan:    Routine Health Maintenance and Physical Exam  Immunization History  Administered Date(s) Administered   Tdap 08/18/2011, 06/03/2022    Health Maintenance  Topic Date Due   MAMMOGRAM  10/30/2022   INFLUENZA VACCINE  08/14/2023   Cervical Cancer Screening (HPV/Pap Cotest)  09/15/2026   DTaP/Tdap/Td (3 - Td or Tdap) 06/02/2032   Hepatitis C Screening  Completed   HIV Screening  Completed   HPV VACCINES  Aged Out   Meningococcal B Vaccine  Aged Out   COVID-19 Vaccine  Discontinued    Discussed health benefits of physical activity, and encouraged her to engage in regular exercise appropriate for her age and condition.  Aaron AasBarbra Ley was seen today for annual exam.  Diagnoses and all orders for this visit:  Routine physical examination  Class 2 obesity due to excess calories without serious comorbidity with body mass index (BMI) of 38.0 to 38.9 in adult  Metabolic syndrome  Prediabetes  Hepatic steatosis   .Aaron Aas Discussed 150 minutes of exercise a week.  Encouraged vitamin D  1000 units and Calcium 1300mg  or 4 servings of dairy a day.  Fasting labs done and reviewed in office today Pre-diabetic Zepbound  approved and needs to get through [facility] for 90 days She has the 5mg . 2.5mg  given for 4 months in office today from samples.  Follow up in 3 months to discuss weight loss Mammogram done, will request records.  Pap UTD Vaccines UTD      Sandy Crumb, PA-C

## 2023-06-06 ENCOUNTER — Encounter: Payer: Self-pay | Admitting: Physician Assistant

## 2023-07-20 DIAGNOSIS — M9901 Segmental and somatic dysfunction of cervical region: Secondary | ICD-10-CM | POA: Diagnosis not present

## 2023-07-20 DIAGNOSIS — M9905 Segmental and somatic dysfunction of pelvic region: Secondary | ICD-10-CM | POA: Diagnosis not present

## 2023-07-20 DIAGNOSIS — M9902 Segmental and somatic dysfunction of thoracic region: Secondary | ICD-10-CM | POA: Diagnosis not present

## 2023-07-20 DIAGNOSIS — M9903 Segmental and somatic dysfunction of lumbar region: Secondary | ICD-10-CM | POA: Diagnosis not present

## 2023-07-23 DIAGNOSIS — S90455A Superficial foreign body, left lesser toe(s), initial encounter: Secondary | ICD-10-CM | POA: Diagnosis not present

## 2023-08-06 DIAGNOSIS — M4728 Other spondylosis with radiculopathy, sacral and sacrococcygeal region: Secondary | ICD-10-CM | POA: Diagnosis not present

## 2023-08-06 DIAGNOSIS — M25531 Pain in right wrist: Secondary | ICD-10-CM | POA: Diagnosis not present

## 2023-08-06 DIAGNOSIS — M4726 Other spondylosis with radiculopathy, lumbar region: Secondary | ICD-10-CM | POA: Diagnosis not present

## 2023-08-06 DIAGNOSIS — M4723 Other spondylosis with radiculopathy, cervicothoracic region: Secondary | ICD-10-CM | POA: Diagnosis not present

## 2023-08-11 ENCOUNTER — Encounter: Payer: Self-pay | Admitting: Physician Assistant

## 2023-08-31 DIAGNOSIS — M25531 Pain in right wrist: Secondary | ICD-10-CM | POA: Diagnosis not present

## 2023-08-31 DIAGNOSIS — M4726 Other spondylosis with radiculopathy, lumbar region: Secondary | ICD-10-CM | POA: Diagnosis not present

## 2023-08-31 DIAGNOSIS — M4728 Other spondylosis with radiculopathy, sacral and sacrococcygeal region: Secondary | ICD-10-CM | POA: Diagnosis not present

## 2023-08-31 DIAGNOSIS — M4723 Other spondylosis with radiculopathy, cervicothoracic region: Secondary | ICD-10-CM | POA: Diagnosis not present

## 2023-09-07 ENCOUNTER — Other Ambulatory Visit: Payer: Self-pay | Admitting: Physician Assistant

## 2023-09-07 DIAGNOSIS — E8881 Metabolic syndrome: Secondary | ICD-10-CM

## 2023-09-07 DIAGNOSIS — E66813 Obesity, class 3: Secondary | ICD-10-CM

## 2023-09-07 DIAGNOSIS — R748 Abnormal levels of other serum enzymes: Secondary | ICD-10-CM

## 2023-09-07 DIAGNOSIS — K760 Fatty (change of) liver, not elsewhere classified: Secondary | ICD-10-CM

## 2023-09-13 DIAGNOSIS — R300 Dysuria: Secondary | ICD-10-CM | POA: Diagnosis not present

## 2023-09-21 ENCOUNTER — Encounter: Payer: Self-pay | Admitting: Physician Assistant

## 2023-09-21 ENCOUNTER — Ambulatory Visit: Admitting: Physician Assistant

## 2023-09-21 VITALS — BP 134/80 | HR 67 | Ht 64.0 in | Wt 216.0 lb

## 2023-09-21 DIAGNOSIS — G43711 Chronic migraine without aura, intractable, with status migrainosus: Secondary | ICD-10-CM | POA: Diagnosis not present

## 2023-09-21 DIAGNOSIS — E6609 Other obesity due to excess calories: Secondary | ICD-10-CM

## 2023-09-21 DIAGNOSIS — E66812 Obesity, class 2: Secondary | ICD-10-CM

## 2023-09-21 DIAGNOSIS — Z6837 Body mass index (BMI) 37.0-37.9, adult: Secondary | ICD-10-CM

## 2023-09-21 DIAGNOSIS — F411 Generalized anxiety disorder: Secondary | ICD-10-CM

## 2023-09-21 DIAGNOSIS — R7303 Prediabetes: Secondary | ICD-10-CM

## 2023-09-21 DIAGNOSIS — E8881 Metabolic syndrome: Secondary | ICD-10-CM

## 2023-09-21 MED ORDER — ZEPBOUND 7.5 MG/0.5ML ~~LOC~~ SOAJ: 7.5000 mg | SUBCUTANEOUS | 0 refills | Status: DC

## 2023-09-21 MED ORDER — ESCITALOPRAM OXALATE 5 MG PO TABS: 5.0000 mg | ORAL_TABLET | Freq: Every day | ORAL | 0 refills | Status: AC

## 2023-09-21 MED ORDER — NURTEC 75 MG PO TBDP: 1.0000 | ORAL_TABLET | ORAL | 1 refills | Status: DC

## 2023-09-21 NOTE — Patient Instructions (Signed)

## 2023-09-21 NOTE — Progress Notes (Signed)
 Established Patient Office Visit  Subjective   Patient ID: Brantley Naser, female    DOB: Mar 07, 1980  Age: 43 y.o. MRN: [id number]   HPI Pt is a 43 yo obese female who presents to the clinic for refill on zepbound . She is doing well and just finished a month on 5mg . She denies any side effects. She has been taking for 3 months and lost 6lbs. She is trying to exercise more and eat better. Her appetite has decreased.   She continues to have HA daily but do not feel like migraines. They are dull and squeezing in nature. She takes magnesium at bedtime and excedrin migraine daily. She is light and sound sensitive. Denies any nausea or vomiting. Failed topamax. Nurtec too expensive. She admits she struggles with anxiety and very stressed all the time. She wonders if treating her anxiety will help her headaches. She sleeps great.    ROS See HPI.    Objective:     BP 134/80   Pulse 67   Ht 5' 4 (1.626 m)   Wt 216 lb (98 kg)   SpO2 99%   BMI 37.08 kg/m  BP Readings from Last 3 Encounters:  09/21/23 134/80  06/05/23 129/72  06/02/22 137/78   Wt Readings from Last 3 Encounters:  09/21/23 216 lb (98 kg)  06/05/23 222 lb 9.6 oz (101 kg)  06/02/22 234 lb (106.1 kg)    ..    09/21/2023    1:48 PM 06/06/2023    7:25 AM 03/25/2021   10:32 AM 03/19/2020   10:11 AM 12/13/2018   11:24 AM  Depression screen PHQ 2/9  Decreased Interest 0 0 0 0 0  Down, Depressed, Hopeless 0 0 0 0 0  PHQ - 2 Score 0 0 0 0 0  Altered sleeping    0 0  Tired, decreased energy    0 0  Change in appetite    0 0  Feeling bad or failure about yourself     0 0  Trouble concentrating    0 0  Moving slowly or fidgety/restless    0 0  Suicidal thoughts    0 0  PHQ-9 Score    0 0  Difficult doing work/chores    Not difficult at all Not difficult at all      Physical Exam Constitutional:      Appearance: Normal appearance. She is obese.  HENT:     Head: Normocephalic.  Eyes:     Extraocular Movements:  Extraocular movements intact.     Conjunctiva/sclera: Conjunctivae normal.     Pupils: Pupils are equal, round, and reactive to light.  Cardiovascular:     Rate and Rhythm: Normal rate and regular rhythm.  Pulmonary:     Effort: Pulmonary effort is normal.     Breath sounds: Normal breath sounds.  Musculoskeletal:     Cervical back: Normal range of motion and neck supple. No tenderness.  Lymphadenopathy:     Cervical: No cervical adenopathy.  Neurological:     General: No focal deficit present.     Mental Status: She is alert and oriented to person, place, and time.     Motor: No weakness.  Psychiatric:        Mood and Affect: Mood normal.      The 10-year ASCVD risk score (Arnett DK, et al., 2019) is: 0.5%    Assessment & Plan:  SABRASABRALauren was seen today for migraine.  Diagnoses and all orders for this  visit:  Class 2 obesity due to excess calories without serious comorbidity with body mass index (BMI) of 37.0 to 37.9 in adult -     tirzepatide  (ZEPBOUND ) 7.5 MG/0.5ML Pen; Inject 7.5 mg into the skin once a week.  Intractable chronic migraine without aura and with status migrainosus  Prediabetes -     tirzepatide  (ZEPBOUND ) 7.5 MG/0.5ML Pen; Inject 7.5 mg into the skin once a week.  Metabolic syndrome -     tirzepatide  (ZEPBOUND ) 7.5 MG/0.5ML Pen; Inject 7.5 mg into the skin once a week.  GAD (generalized anxiety disorder) -     escitalopram  (LEXAPRO ) 5 MG tablet; Take 1 tablet (5 mg total) by mouth daily.  Other orders -     Discontinue: Rimegepant Sulfate (NURTEC) 75 MG TBDP; Take 1 tablet (75 mg total) by mouth every other day.   Starting weight: 222lb Current weight: 216lb Lost weight: 6lbs Increased zepbound  dose to 7.5mg  weekly and continue with healthy diet and regular exercise Follow up in 3 months  Unclear trigger of migraines and tension headaches I think patient is having more tension headaches which are harder to treat Consider massage every  month Discussed anxiety management with starting lexapro  daily GAD not to goal Continue to make sure getting good rest with good supportive pillow Consider tens unit and heating pad Use excedrin as needed   Pt declines all vaccines today.   Return in about 3 months (around 12/21/2023) for Follow up.    Eliya Bubar, PA-C

## 2023-09-22 MED ORDER — NURTEC 75 MG PO TBDP: 1.0000 | ORAL_TABLET | ORAL | 1 refills | Status: DC

## 2023-09-28 DIAGNOSIS — F411 Generalized anxiety disorder: Secondary | ICD-10-CM | POA: Insufficient documentation

## 2023-11-10 ENCOUNTER — Other Ambulatory Visit: Payer: Self-pay | Admitting: Physician Assistant

## 2023-11-10 DIAGNOSIS — K219 Gastro-esophageal reflux disease without esophagitis: Secondary | ICD-10-CM

## 2023-11-16 DIAGNOSIS — M9905 Segmental and somatic dysfunction of pelvic region: Secondary | ICD-10-CM | POA: Diagnosis not present

## 2023-11-16 DIAGNOSIS — M9901 Segmental and somatic dysfunction of cervical region: Secondary | ICD-10-CM | POA: Diagnosis not present

## 2023-11-16 DIAGNOSIS — M9903 Segmental and somatic dysfunction of lumbar region: Secondary | ICD-10-CM | POA: Diagnosis not present

## 2023-11-16 DIAGNOSIS — M9902 Segmental and somatic dysfunction of thoracic region: Secondary | ICD-10-CM | POA: Diagnosis not present

## 2023-12-06 ENCOUNTER — Other Ambulatory Visit: Payer: Self-pay | Admitting: Physician Assistant

## 2023-12-06 DIAGNOSIS — K219 Gastro-esophageal reflux disease without esophagitis: Secondary | ICD-10-CM

## 2023-12-14 ENCOUNTER — Ambulatory Visit: Admitting: Physician Assistant

## 2023-12-14 ENCOUNTER — Encounter: Payer: Self-pay | Admitting: Physician Assistant

## 2023-12-14 DIAGNOSIS — G43711 Chronic migraine without aura, intractable, with status migrainosus: Secondary | ICD-10-CM | POA: Diagnosis not present

## 2023-12-14 DIAGNOSIS — F411 Generalized anxiety disorder: Secondary | ICD-10-CM

## 2023-12-14 DIAGNOSIS — R7303 Prediabetes: Secondary | ICD-10-CM

## 2023-12-14 DIAGNOSIS — R4589 Other symptoms and signs involving emotional state: Secondary | ICD-10-CM | POA: Diagnosis not present

## 2023-12-14 DIAGNOSIS — E8881 Metabolic syndrome: Secondary | ICD-10-CM

## 2023-12-14 DIAGNOSIS — Z7689 Persons encountering health services in other specified circumstances: Secondary | ICD-10-CM | POA: Insufficient documentation

## 2023-12-14 MED ORDER — NURTEC 75 MG PO TBDP: 1.0000 | ORAL_TABLET | ORAL | 11 refills | Status: AC

## 2023-12-14 MED ORDER — ZEPBOUND 7.5 MG/0.5ML ~~LOC~~ SOAJ: 5.0000 mg | SUBCUTANEOUS | Status: DC

## 2023-12-14 MED ORDER — ZEPBOUND 5 MG/0.5ML ~~LOC~~ SOAJ: 5.0000 mg | SUBCUTANEOUS | Status: AC

## 2023-12-14 NOTE — Progress Notes (Signed)
 Established Patient Office Visit  Subjective   Patient ID: Jill Lloyd, female    DOB: 1980/02/08  Age: 43 y.o. MRN: [id number]  Chief Complaint  Patient presents with   Medical Management of Chronic Issues    HPI .Discussed Jill use of AI scribe software for clinical note transcription with Jill patient, who gave verbal consent to proceed.  History of Present Illness Jill Lloyd is a 43 year old female who presents for follow-up on weight management.  Weight management and appetite - Weight has remained stable over Jill past three months - Starting weight 222lbs and now 215lbs - Currently taking Zepbound  5 mg for weight management - Inconsistent with Zepbound  dosing, missing approximately three weeks due to nausea - Concerned about increasing Zepbound  dose to 7.5 mg due to potential for increased nausea - Dietary intake is limited, often not eating much during Jill day, especially at work to avoid using Jill bathroom - Typical intake includes crackers and cheese - Recently added a protein shake to diet, but intake is inconsistent - Difficulty consuming enough calories throughout Jill day  Nausea - Nausea associated with Zepbound  use, leading to missed doses for approximately three weeks - Concern about worsening nausea with potential dose increase  Headache disorders - History of migraines and tension headaches - Uses Nurtec as a rescue medication for headache management - Headaches are currently manageable - Improvement in headache frequency and/or severity attributed to regular chiropractic visits - Uses ice and showers to alleviate headache symptoms  Mood disturbance and emotional distress - Experiences emotional distress, particularly following a recent family incident involving her son and Jill family dog - Periods of sadness and need for alone time, which is difficult to achieve - Considering starting Lexapro  for mood management but has not initiated therapy -  Concerned about potential weight gain with Lexapro  use    ROS See HPI.    Objective:     BP 128/74   Pulse 74   Ht 5' 4 (1.626 m)   Wt 215 lb (97.5 kg)   SpO2 99%   BMI 36.90 kg/m  BP Readings from Last 3 Encounters:  12/14/23 128/74  09/21/23 134/80  06/05/23 129/72   Wt Readings from Last 3 Encounters:  12/14/23 215 lb (97.5 kg)  09/21/23 216 lb (98 kg)  06/05/23 222 lb 9.6 oz (101 kg)    .SABRA    09/28/2023   12:28 PM 03/19/2020   10:11 AM 12/13/2018   11:24 AM  GAD 7 : Generalized Anxiety Score  Nervous, Anxious, on Edge 2 0 0  Control/stop worrying 2 0 1  Worry too much - different things 1 0 0  Trouble relaxing 2 0 1  Restless 1 0 0  Easily annoyed or irritable 2 0 0  Afraid - awful might happen 2 0 0  Total GAD 7 Score 12 0 2  Anxiety Difficulty Very difficult Not difficult at all Not difficult at all    ..    09/21/2023    1:48 PM 06/06/2023    7:25 AM 03/25/2021   10:32 AM 03/19/2020   10:11 AM 12/13/2018   11:24 AM  Depression screen PHQ 2/9  Decreased Interest 0 0 0 0 0  Down, Depressed, Hopeless 0 0 0 0 0  PHQ - 2 Score 0 0 0 0 0  Altered sleeping    0 0  Tired, decreased energy    0 0  Change in appetite    0 0  Feeling bad or failure about yourself     0 0  Trouble concentrating    0 0  Moving slowly or fidgety/restless    0 0  Suicidal thoughts    0 0  PHQ-9 Score    0  0   Difficult doing work/chores    Not difficult at all Not difficult at all     Data saved with a previous flowsheet row definition     Physical Exam Constitutional:      Appearance: Normal appearance.  HENT:     Head: Normocephalic.  Cardiovascular:     Rate and Rhythm: Normal rate and regular rhythm.  Pulmonary:     Effort: Pulmonary effort is normal.     Breath sounds: Normal breath sounds.  Neurological:     General: No focal deficit present.     Mental Status: She is alert and oriented to person, place, and time.  Psychiatric:        Mood and Affect: Mood  normal.       Jill Lloyd, Jill al., 2019) is: 0.5%    Assessment & Plan:  .Jearlean was seen today for medical management of chronic issues.  Diagnoses and all orders for this visit:  Morbid obesity (HCC) -     tirzepatide  (ZEPBOUND ) 5 MG/0.5ML Pen; Inject 5 mg into Jill skin once a week.  Metabolic syndrome -     Discontinue: tirzepatide  (ZEPBOUND ) 7.5 MG/0.5ML Pen; Inject 5 mg into Jill skin once a week. -     tirzepatide  (ZEPBOUND ) 5 MG/0.5ML Pen; Inject 5 mg into Jill skin once a week.  GAD (generalized anxiety disorder)  Depressed mood  Intractable chronic migraine without aura and with status migrainosus -     Rimegepant Sulfate (NURTEC) 75 MG TBDP; Take 1 tablet (75 mg total) by mouth every other day.  Prediabetes -     Discontinue: tirzepatide  (ZEPBOUND ) 7.5 MG/0.5ML Pen; Inject 5 mg into Jill skin once a week. -     tirzepatide  (ZEPBOUND ) 5 MG/0.5ML Pen; Inject 5 mg into Jill skin once a week.   Assessment & Plan Class 2 obesity due to excess calories/Morbid obesity Weight stable for three months. Inconsistent Tirzepatide  use due to nausea and side effect concerns. Current dose 5 mg. Fear of increased nausea and reduced appetite with higher dose. Insufficient calorie intake noted. - Continue Tirzepatide  5 mg weekly. - Encouraged at least 1200 calories daily, including protein shakes and healthy foods. - Advised against increasing Tirzepatide  to 7.5 mg due to side effect concerns. - Encouraged regular physical activity, such as walking.  Chronic migraine Headaches manageable with Nurtec as rescue medication. Effective for acute episodes, less than eight headaches per month. - Continue Nurtec as rescue medication for acute migraine episodes.  Generalized anxiety disorder and depression Periods of sadness and distress, especially post-conflict. Inconsistent Escitalopram  use due to weight gain concerns. Acknowledges need for medication if  distress increases. - Encouraged consistent Escitalopram  use if distress increases. - Advised to monitor mood and report significant changes.     Return in about 6 months (around 06/13/2024).    Krupa Stege, PA-C

## 2023-12-18 ENCOUNTER — Other Ambulatory Visit (HOSPITAL_COMMUNITY): Payer: Self-pay

## 2023-12-21 DIAGNOSIS — Z1231 Encounter for screening mammogram for malignant neoplasm of breast: Secondary | ICD-10-CM | POA: Diagnosis not present

## 2023-12-21 DIAGNOSIS — R92313 Mammographic fatty tissue density, bilateral breasts: Secondary | ICD-10-CM | POA: Diagnosis not present

## 2024-02-16 ENCOUNTER — Encounter: Payer: Self-pay | Admitting: Physician Assistant

## 2024-02-17 MED ORDER — SCOPOLAMINE 1 MG/3DAYS TD PT72: 1.0000 | MEDICATED_PATCH | TRANSDERMAL | 0 refills | Status: AC
